# Patient Record
Sex: Male | Born: 1955 | Race: White | Hispanic: No | Marital: Married | Smoking: Former smoker
Health system: Southern US, Community
[De-identification: ages and names within clinical notes are randomized; demographics above are authoritative.]

## PROBLEM LIST (undated history)

## (undated) DIAGNOSIS — Z860100 Personal history of colon polyps, unspecified: Secondary | ICD-10-CM

## (undated) DIAGNOSIS — I495 Sick sinus syndrome: Secondary | ICD-10-CM

## (undated) DIAGNOSIS — Z87891 Personal history of nicotine dependence: Secondary | ICD-10-CM

## (undated) DIAGNOSIS — E739 Lactose intolerance, unspecified: Secondary | ICD-10-CM

## (undated) DIAGNOSIS — E785 Hyperlipidemia, unspecified: Secondary | ICD-10-CM

## (undated) DIAGNOSIS — M722 Plantar fascial fibromatosis: Secondary | ICD-10-CM

## (undated) DIAGNOSIS — Z8619 Personal history of other infectious and parasitic diseases: Secondary | ICD-10-CM

## (undated) DIAGNOSIS — K219 Gastro-esophageal reflux disease without esophagitis: Secondary | ICD-10-CM

## (undated) DIAGNOSIS — R931 Abnormal findings on diagnostic imaging of heart and coronary circulation: Secondary | ICD-10-CM

## (undated) HISTORY — DX: Abnormal findings on diagnostic imaging of heart and coronary circulation: R93.1

## (undated) HISTORY — DX: Morbid (severe) obesity due to excess calories: E66.01

## (undated) HISTORY — DX: Hyperlipidemia, unspecified: E78.5

## (undated) HISTORY — DX: Plantar fascial fibromatosis: M72.2

## (undated) HISTORY — DX: Personal history of nicotine dependence: Z87.891

## (undated) HISTORY — PX: COLONOSCOPY: SHX174

## (undated) HISTORY — DX: Sick sinus syndrome: I49.5

## (undated) HISTORY — DX: Gastro-esophageal reflux disease without esophagitis: K21.9

## (undated) HISTORY — DX: Lactose intolerance, unspecified: E73.9

## (undated) HISTORY — DX: Personal history of colonic polyps: Z86.010

## (undated) HISTORY — DX: Personal history of colon polyps, unspecified: Z86.0100

## (undated) HISTORY — DX: Personal history of other infectious and parasitic diseases: Z86.19

## (undated) HISTORY — PX: ROTATOR CUFF REPAIR: SHX139

---

## 1999-03-07 ENCOUNTER — Emergency Department (HOSPITAL_COMMUNITY): Admission: EM | Admit: 1999-03-07 | Discharge: 1999-03-07 | Payer: Self-pay | Admitting: *Deleted

## 2005-01-18 ENCOUNTER — Ambulatory Visit: Payer: Self-pay | Admitting: Internal Medicine

## 2005-01-25 ENCOUNTER — Ambulatory Visit: Payer: Self-pay | Admitting: Internal Medicine

## 2006-05-16 ENCOUNTER — Ambulatory Visit: Payer: Self-pay | Admitting: Internal Medicine

## 2006-05-25 ENCOUNTER — Ambulatory Visit: Payer: Self-pay | Admitting: Internal Medicine

## 2006-07-13 ENCOUNTER — Ambulatory Visit: Payer: Self-pay | Admitting: Internal Medicine

## 2006-07-31 ENCOUNTER — Ambulatory Visit: Payer: Self-pay | Admitting: Internal Medicine

## 2006-07-31 ENCOUNTER — Encounter (INDEPENDENT_AMBULATORY_CARE_PROVIDER_SITE_OTHER): Payer: Self-pay | Admitting: Specialist

## 2006-07-31 LAB — HM COLONOSCOPY

## 2007-06-27 ENCOUNTER — Ambulatory Visit: Payer: Self-pay | Admitting: Internal Medicine

## 2007-06-27 LAB — CONVERTED CEMR LAB
AST: 24 units/L (ref 0–37)
Albumin: 4.2 g/dL (ref 3.5–5.2)
Basophils Relative: 0.5 % (ref 0.0–1.0)
Bilirubin, Direct: 0.1 mg/dL (ref 0.0–0.3)
Chloride: 108 meq/L (ref 96–112)
Cholesterol: 143 mg/dL (ref 0–200)
Creatinine, Ser: 1 mg/dL (ref 0.4–1.5)
Eosinophils Relative: 4.1 % (ref 0.0–5.0)
Glucose, Bld: 114 mg/dL — ABNORMAL HIGH (ref 70–99)
HCT: 41.4 % (ref 39.0–52.0)
Hgb A1c MFr Bld: 5.8 % (ref 4.6–6.0)
Ketones, ur: NEGATIVE mg/dL
LDL Cholesterol: 81 mg/dL (ref 0–99)
Neutrophils Relative %: 48.2 % (ref 43.0–77.0)
Nitrite: NEGATIVE
RBC: 4.78 M/uL (ref 4.22–5.81)
RDW: 12.8 % (ref 11.5–14.6)
Sodium: 141 meq/L (ref 135–145)
Total Bilirubin: 1.2 mg/dL (ref 0.3–1.2)
Urobilinogen, UA: 0.2 (ref 0.0–1.0)
VLDL: 14 mg/dL (ref 0–40)
WBC: 4.9 10*3/uL (ref 4.5–10.5)
pH: 6 (ref 5.0–8.0)

## 2007-07-05 ENCOUNTER — Ambulatory Visit: Payer: Self-pay | Admitting: Internal Medicine

## 2007-07-06 ENCOUNTER — Encounter: Payer: Self-pay | Admitting: Internal Medicine

## 2007-07-06 DIAGNOSIS — I495 Sick sinus syndrome: Secondary | ICD-10-CM | POA: Insufficient documentation

## 2007-07-06 DIAGNOSIS — E739 Lactose intolerance, unspecified: Secondary | ICD-10-CM

## 2007-07-06 DIAGNOSIS — E785 Hyperlipidemia, unspecified: Secondary | ICD-10-CM

## 2007-07-06 DIAGNOSIS — K219 Gastro-esophageal reflux disease without esophagitis: Secondary | ICD-10-CM

## 2008-08-15 ENCOUNTER — Telehealth: Payer: Self-pay | Admitting: Internal Medicine

## 2008-08-18 ENCOUNTER — Telehealth: Payer: Self-pay | Admitting: Internal Medicine

## 2008-08-27 ENCOUNTER — Ambulatory Visit: Payer: Self-pay | Admitting: Internal Medicine

## 2008-08-27 LAB — CONVERTED CEMR LAB
AST: 23 units/L (ref 0–37)
Alkaline Phosphatase: 31 units/L — ABNORMAL LOW (ref 39–117)
Basophils Absolute: 0 10*3/uL (ref 0.0–0.1)
Bilirubin Urine: NEGATIVE
Chloride: 103 meq/L (ref 96–112)
Cholesterol: 189 mg/dL (ref 0–200)
Eosinophils Absolute: 0.2 10*3/uL (ref 0.0–0.7)
GFR calc non Af Amer: 83 mL/min
Hemoglobin, Urine: NEGATIVE
LDL Cholesterol: 111 mg/dL — ABNORMAL HIGH (ref 0–99)
MCHC: 34.3 g/dL (ref 30.0–36.0)
MCV: 88.9 fL (ref 78.0–100.0)
Neutrophils Relative %: 54.4 % (ref 43.0–77.0)
Platelets: 171 10*3/uL (ref 150–400)
Potassium: 4.5 meq/L (ref 3.5–5.1)
RDW: 12.6 % (ref 11.5–14.6)
Sodium: 140 meq/L (ref 135–145)
TSH: 1.13 microintl units/mL (ref 0.35–5.50)
Total Bilirubin: 1.2 mg/dL (ref 0.3–1.2)
Urine Glucose: NEGATIVE mg/dL
Urobilinogen, UA: 0.2 (ref 0.0–1.0)
VLDL: 35 mg/dL (ref 0–40)
WBC: 5 10*3/uL (ref 4.5–10.5)

## 2008-09-01 ENCOUNTER — Ambulatory Visit: Payer: Self-pay | Admitting: Internal Medicine

## 2008-09-01 DIAGNOSIS — Z860100 Personal history of colon polyps, unspecified: Secondary | ICD-10-CM | POA: Insufficient documentation

## 2009-10-06 ENCOUNTER — Ambulatory Visit: Payer: Self-pay | Admitting: Internal Medicine

## 2009-10-06 LAB — CONVERTED CEMR LAB
ALT: 24 units/L (ref 0–53)
BUN: 11 mg/dL (ref 6–23)
Basophils Absolute: 0.1 10*3/uL (ref 0.0–0.1)
Bilirubin, Direct: 0.2 mg/dL (ref 0.0–0.3)
Chloride: 105 meq/L (ref 96–112)
Cholesterol: 149 mg/dL (ref 0–200)
Creatinine, Ser: 1 mg/dL (ref 0.4–1.5)
Eosinophils Relative: 4.8 % (ref 0.0–5.0)
GFR calc non Af Amer: 82.7 mL/min (ref >60)
Glucose, Bld: 99 mg/dL (ref 70–99)
LDL Cholesterol: 91 mg/dL (ref 0–99)
Leukocytes, UA: NEGATIVE
Lymphs Abs: 1.8 10*3/uL (ref 0.7–4.0)
MCV: 92.4 fL (ref 78.0–100.0)
Monocytes Absolute: 0.3 10*3/uL (ref 0.1–1.0)
Neutrophils Relative %: 45.9 % (ref 43.0–77.0)
Nitrite: NEGATIVE
PSA: 1.45 ng/mL (ref 0.10–4.00)
Platelets: 177 10*3/uL (ref 150.0–400.0)
RDW: 12.3 % (ref 11.5–14.6)
Specific Gravity, Urine: 1.015 (ref 1.000–1.030)
TSH: 0.99 microintl units/mL (ref 0.35–5.50)
Total Bilirubin: 1.2 mg/dL (ref 0.3–1.2)
Triglycerides: 57 mg/dL (ref 0.0–149.0)
Urobilinogen, UA: 0.2 (ref 0.0–1.0)
VLDL: 11.4 mg/dL (ref 0.0–40.0)
WBC: 4.4 10*3/uL — ABNORMAL LOW (ref 4.5–10.5)
pH: 6.5 (ref 5.0–8.0)

## 2009-10-13 ENCOUNTER — Ambulatory Visit: Payer: Self-pay | Admitting: Internal Medicine

## 2009-10-13 DIAGNOSIS — Z87891 Personal history of nicotine dependence: Secondary | ICD-10-CM

## 2009-10-13 DIAGNOSIS — M722 Plantar fascial fibromatosis: Secondary | ICD-10-CM

## 2009-12-16 ENCOUNTER — Telehealth: Payer: Self-pay | Admitting: Internal Medicine

## 2010-10-14 ENCOUNTER — Ambulatory Visit: Payer: Self-pay | Admitting: Internal Medicine

## 2010-10-14 LAB — CONVERTED CEMR LAB
Albumin: 4.1 g/dL (ref 3.5–5.2)
Basophils Absolute: 0 10*3/uL (ref 0.0–0.1)
Basophils Relative: 0.7 % (ref 0.0–3.0)
Bilirubin, Direct: 0.1 mg/dL (ref 0.0–0.3)
CO2: 28 meq/L (ref 19–32)
Calcium: 9 mg/dL (ref 8.4–10.5)
Chloride: 103 meq/L (ref 96–112)
Cholesterol: 144 mg/dL (ref 0–200)
Creatinine, Ser: 1.1 mg/dL (ref 0.4–1.5)
Eosinophils Absolute: 0.2 10*3/uL (ref 0.0–0.7)
Glucose, Bld: 106 mg/dL — ABNORMAL HIGH (ref 70–99)
HDL: 43.6 mg/dL (ref >39.00)
Hemoglobin, Urine: NEGATIVE
Lymphocytes Relative: 39.7 % (ref 12.0–46.0)
MCHC: 34.2 g/dL (ref 30.0–36.0)
MCV: 90.3 fL (ref 78.0–100.0)
Monocytes Absolute: 0.3 10*3/uL (ref 0.1–1.0)
Neutro Abs: 2.3 10*3/uL (ref 1.4–7.7)
Neutrophils Relative %: 49.8 % (ref 43.0–77.0)
Nitrite: NEGATIVE
PSA: 0.77 ng/mL (ref 0.10–4.00)
RBC: 4.65 M/uL (ref 4.22–5.81)
RDW: 13 % (ref 11.5–14.6)
Specific Gravity, Urine: 1.025 (ref 1.000–1.030)
Total CHOL/HDL Ratio: 3
Total Protein: 6.7 g/dL (ref 6.0–8.3)
Triglycerides: 70 mg/dL (ref 0.0–149.0)
Urobilinogen, UA: 0.2 (ref 0.0–1.0)

## 2010-10-19 ENCOUNTER — Encounter: Payer: Self-pay | Admitting: Internal Medicine

## 2010-10-19 ENCOUNTER — Ambulatory Visit: Payer: Self-pay | Admitting: Internal Medicine

## 2010-12-14 NOTE — Assessment & Plan Note (Signed)
Summary: PHYSICAL---STC   Vital Signs:  Patient profile:   55 year old male Height:      72 inches Weight:      243 pounds BMI:     33.08 O2 Sat:      96 % on Room air Temp:     98.0 degrees F oral Pulse rate:   53 / minute BP sitting:   122 / 70  (left arm) Cuff size:   regular  Vitals Entered By: Bill Salinas CMA (October 19, 2010 8:36 AM)  O2 Flow:  Room air  Procedure Note Last Tetanus: Tdap (03/28/2002)  Injections: The patient complains of pain and inflammation. Duration of symptoms: > 3 months Indication: chronic pain Consent signed: yes  Procedure # 1: plantar fascia inj    Region: palmar    Location: R heel    Technique: 25 g needle    Medication: 20 mg depomedrol    Anesthesia: 2.0 ml 1% lidocaine w/o epinephrine    Comment: Risks including but not limited by incomplete procedure, bleeding, infection, recurrence were discussed with the patient. Consent form was signed. US guided inj was performed. Tolerated well. Complicatons - none. Good pain relief following the procedure.   Cleaned and prepped with: alcohol and betadine Wound dressing: bandaid  CC: cpx  Does patient need assistance? Ambulation Normal   CC:  cpx.  History of Present Illness: The patient presents for a preventive health examination  Can he have a shingles shot? C/o R heel pain x months    Current Medications (verified): 1)  Pravachol 40 Mg Tabs (Pravastatin Sodium) .[name].. 1 By Mouth Daily Yearly Cpx Is Due No Refills Until Appt 2)  Omeprazole 20 Mg Cpdr (Omeprazole) .... 2po Once Daily 3)  Adult Aspirin Ec Low Strength 81 Mg Tbec (Aspirin) .[name].. 1 By Mouth Once Daily 4)  Vitamin D 1000 Unit Tabs (Cholecalciferol) .[name].. 1 By Mouth Qd 5)  Ibuprofen 600 Mg Tabs (Ibuprofen) .[name].. 1 By Mouth Bid  Pc X 1 Wk Then As Needed For  Pain 6)  Daily Multi  Tabs (Multiple Vitamins-Minerals) .[name].. 1 Tablet Once Daily 7)  Oyster Shell Calcium/d 250-125 Mg-Unit Tabs (Calcium Carbonate-Vitamin  D)  Allergies (verified): 1)  ! Tetracycline 2)  ! Mevacor  Past History:  Past Medical History: Last updated: 09/01/2008 Glucose Intolerance GERD Hyperlipidemia Morbid Obesity Chronic Sinus Bradycardia Colonic polyps, hx of - rectal  - hyperplastic only 2007 hx of shingles  Past Surgical History: Last updated: 09/01/2008 Rotator cuff repair - right  - approx 1995 - dr Turner Daniels  Family History: Last updated: 09/01/2008 father and sister with PUD, mother with gallbladder disease multiple with dementia but lived to old age (grandparents)  Social History: Last updated: 09/01/2008 work - building maintenance Alcohol use-yes Former Smoker Married 1 child  Review of Systems       The patient complains of difficulty walking.  The patient denies anorexia, fever, weight loss, weight gain, vision loss, decreased hearing, hoarseness, chest pain, syncope, dyspnea on exertion, peripheral edema, prolonged cough, headaches, hemoptysis, abdominal pain, melena, hematochezia, severe indigestion/heartburn, hematuria, incontinence, genital sores, muscle weakness, suspicious skin lesions, transient blindness, depression, unusual weight change, abnormal bleeding, enlarged lymph nodes, angioedema, and testicular masses.    Physical Exam  General:  overweight-appearing.   Head:  Normocephalic and atraumatic without obvious abnormalities. No apparent alopecia or balding. Eyes:  No corneal or conjunctival inflammation noted. EOMI. Perrla. Funduscopic exam benign, without hemorrhages, exudates or papilledema. Vision grossly normal.  Ears:  External ear exam shows no significant lesions or deformities.  Otoscopic examination reveals clear canals, tympanic membranes are intact bilaterally without bulging, retraction, inflammation or discharge. Hearing is grossly normal bilaterally. Nose:  External nasal examination shows no deformity or inflammation. Nasal mucosa are pink and moist without lesions or  exudates. Mouth:  Oral mucosa and oropharynx without lesions or exudates.  Teeth in good repair. Neck:  No deformities, masses, or tenderness noted. Lungs:  Normal respiratory effort, chest expands symmetrically. Lungs are clear to auscultation, no crackles or wheezes. Heart:  Normal rate and regular rhythm. S1 and S2 normal without gallop, murmur, click, rub or other extra sounds. Abdomen:  Bowel sounds positive,abdomen soft and non-tender without masses, organomegaly or hernias noted. Rectal:  No external abnormalities noted. Normal sphincter tone. No rectal masses or tenderness. G(-) Genitalia:  Testes bilaterally descended without nodularity, tenderness or masses. No scrotal masses or lesions. No penis lesions or urethral discharge. Prostate:  1+ enlarged.   Msk:  No deformity or scoliosis noted of thoracic or lumbar spine.  R heel is tender Pulses:  R and L carotid,radial,femoral,dorsalis pedis and posterior tibial pulses are full and equal bilaterally Extremities:  No clubbing, cyanosis, edema, or deformity noted with normal full range of motion of all joints.   Neurologic:  No cranial nerve deficits noted. Station and gait are normal. Plantar reflexes are down-going bilaterally. DTRs are symmetrical throughout. Sensory, motor and coordinative functions appear intact. Skin:  Intact without suspicious lesions or rashes Cervical Nodes:  No lymphadenopathy noted Inguinal Nodes:  No significant adenopathy Psych:  Cognition and judgment appear intact. Alert and cooperative with normal attention span and concentration. No apparent delusions, illusions, hallucinations   Impression & Recommendations:  Problem # 1:  PREVENTIVE HEALTH CARE (ICD-V70.0) Assessment New  Health and age related issues were discussed. Available screening tests and vaccinations were discussed as well. Healthy life style including good diet and exercise was discussed.  The labs were reviewed with the patient.    Orders: EKG w/ Interpretation (93000)  Problem # 2:  PLANTAR FASCIITIS (ICD-728.71) R Assessment: New US guided plantar fascia inj His updated medication list for this problem includes:    Ibuprofen 600 Mg Tabs (Ibuprofen) .[name]... 1 by mouth bid  pc x 1 wk then as needed for  pain  Problem # 3:  COLONIC POLYPS, HX OF (ICD-V12.72) Assessment: Comment Only  Problem # 4:  HYPERLIPIDEMIA (ICD-272.4) Assessment: Improved  His updated medication list for this problem includes:    Pravachol 40 Mg Tabs (Pravastatin sodium) .[name]... 1 by mouth daily yearly cpx is due no refills until appt  Problem # 5:  GERD (ICD-530.81)  His updated medication list for this problem includes:    Omeprazole 20 Mg Cpdr (Omeprazole) .[name]... 2po once daily  Complete Medication List: 1)  Pravachol 40 Mg Tabs (Pravastatin sodium) .[name].. 1 by mouth daily yearly cpx is due no refills until appt 2)  Omeprazole 20 Mg Cpdr (Omeprazole) .... 2po once daily 3)  Adult Aspirin Ec Low Strength 81 Mg Tbec (Aspirin) .[name].. 1 by mouth once daily 4)  Vitamin D 1000 Unit Tabs (Cholecalciferol) .[name].. 1 by mouth qd 5)  Ibuprofen 600 Mg Tabs (Ibuprofen) .[name].. 1 by mouth bid  pc x 1 wk then as needed for  pain  Other Orders: Depo-Medrol 20mg  (J1020) EMR Misc Charge Code [facility])  Patient Instructions: 1)  Please schedule a follow-up appointment in 1 year well w/labs. Prescriptions: IBUPROFEN 600 MG TABS (  IBUPROFEN) 1 by mouth bid  pc x 1 wk then as needed for  pain  #60 x 3   Entered and Authorized by:   Tresa Garter MD   Signed by:   Tresa Garter MD on 10/19/2010   Method used:   Electronically to        [facility] [address] (retail)       [address], [address]       [place], [place]  [address]       Ph: [phone]       Fax: [phone]   RxID:   [id number] OMEPRAZOLE 20 MG CPDR (OMEPRAZOLE) 2po once daily  #180 x 3   Entered and Authorized by:   Tresa Garter  MD   Signed by:   Tresa Garter MD on 10/19/2010   Method used:   Electronically to        [facility] [address] (retail)       [address], [address]       [place], [place]  [address]       Ph: [phone]       Fax: [phone]   RxID:   [id number] PRAVACHOL 40 MG TABS (PRAVASTATIN SODIUM) 1 by mouth daily yearly cpx is due no refills until appt  #90 x 3   Entered and Authorized by:   Tresa Garter MD   Signed by:   Tresa Garter MD on 10/19/2010   Method used:   Electronically to        [facility] [address] (retail)       [address], [address]       [place], [place]  [address]       Ph: [phone]       Fax: [phone]   RxID:   [id number]    Orders Added: 1)  Est. Patient 40-64 years [99396] 2)  Depo-Medrol 20mg  [J1020] 3)  EMR Misc Charge Code [EMRMisc] 4)  EKG w/ Interpretation [93000]

## 2010-12-14 NOTE — Progress Notes (Signed)
Summary: Psychiatric nurse of Call: Left message on machine to call back to office. Patient concealed hand gun permit is complete, and has been faxed. ?Does patient need a copy mailed to his home? Initial call taken by: Lucious Groves,  December 16, 2009 1:30 PM  Follow-up for Phone Call        Pt called back and said he does not need a copy mailed to his home as long it was faxed that will be fine per pt. Follow-up by: Verdell Face,  December 16, 2009 4:31 PM  Additional Follow-up for Phone Call Additional follow up Details #1::        noted. Thanks. Additional Follow-up by: Lucious Groves,  December 17, 2009 11:39 AM

## 2010-12-16 NOTE — Miscellaneous (Signed)
Summary: RT Heel injection/[facility]  RT Heel injection/[facility]   Imported By: Sherian Rein 10/27/2010 11:15:23  _____________________________________________________________________  External Attachment:    Type:   Image     Comment:   External Document

## 2011-10-25 ENCOUNTER — Encounter: Payer: Self-pay | Admitting: Endocrinology

## 2011-10-25 ENCOUNTER — Ambulatory Visit (INDEPENDENT_AMBULATORY_CARE_PROVIDER_SITE_OTHER)
Admission: RE | Admit: 2011-10-25 | Discharge: 2011-10-25 | Disposition: A | Discharge status: Home / Self Care | Payer: 59 | Source: Ambulatory Visit | Attending: Endocrinology | Admitting: Endocrinology

## 2011-10-25 ENCOUNTER — Ambulatory Visit (INDEPENDENT_AMBULATORY_CARE_PROVIDER_SITE_OTHER): Payer: 59 | Admitting: Endocrinology

## 2011-10-25 VITALS — BP 114/66 | HR 69 | Temp 98.3°F

## 2011-10-25 DIAGNOSIS — R05 Cough: Secondary | ICD-10-CM

## 2011-10-25 MED ORDER — PROMETHAZINE-CODEINE 6.25-10 MG/5ML PO SYRP: 5.0000 mL | ORAL_SOLUTION | ORAL | Status: DC | PRN

## 2011-10-25 MED ORDER — AZITHROMYCIN 500 MG PO TABS: 500.0000 mg | ORAL_TABLET | Freq: Every day | ORAL | Status: AC

## 2011-10-25 NOTE — Progress Notes (Signed)
  Subjective:    Patient ID: Joe Williams, male    DOB: [birth year], 55 y.o.   MRN: [id number]  HPI Pt states few weeks of moderate dry-quality cough in the chest, and assoc pain there.  He had recurrent temp of 100 yesterday, and wheezing. Past Medical History  Diagnosis Date  . COLONIC POLYPS, HX OF 09/01/2008    rectal-hyperplastic only 2007  . GERD 07/06/2007  . GLUCOSE INTOLERANCE 07/06/2007  . HYPERLIPIDEMIA 07/06/2007  . PLANTAR FASCIITIS 10/13/2009  . TOBACCO USE, QUIT 10/13/2009  . DYSFUNCTION, SINOATRIAL NODE 07/06/2007  . History of shingles   . Sinus bradycardia     chronic  . Morbid obesity     Past Surgical History  Procedure Date  . Rotator cuff repair 1995 (approx)    right -Dr. Turner Daniels    History   Social History  . Marital Status: Married    Spouse Name: N/A    Number of Children: 1  . Years of Education: N/A   Occupational History  . Building Maintenance  [employer]   Social History Main Topics  . Smoking status: Former Games developer  . Smokeless tobacco: Not on file  . Alcohol Use: Yes  . Drug Use:   . Sexually Active:    Other Topics Concern  . Not on file   Social History Narrative  . No narrative on file    No current outpatient prescriptions on file prior to visit.    Allergies  Allergen Reactions  . Lovastatin   . Tetracycline     Family History  Problem Relation Age of Onset  . Gallbladder disease Mother   . Ulcers Father   . Ulcers Sister   . Dementia Other     Multiple with dementia but lived to old age    BP 114/66  Pulse 69  Temp(Src) 98.3 F (36.8 C) (Oral)  SpO2 97%    Review of Systems Denies n/v, but he has doe.      Objective:   Physical Exam VITAL SIGNS:  See vs page GENERAL: no distress head: no deformity eyes: no periorbital swelling, no proptosis external nose and ears are normal mouth: no lesion seen Left tm is red, but the right is normal NECK: There is no palpable thyroid enlargement.  No  thyroid nodule is palpable.  No palpable lymphadenopathy at the anterior neck. LUNGS:  Clear to auscultation.   (cxr: nad)    Assessment & Plan:  Acute bronchitis, new

## 2011-10-25 NOTE — Patient Instructions (Addendum)
Let's check a chest-x-ray.  Then please call [phone] to hear your test results.  You will be prompted to enter the 9-digit "MRN" number that appears at the top left of this page, followed by #.  Then you will hear the message.   here is a sample of "dulera-200."  take 1 puff 2x a day.  rinse mouth after using. Here are 2 prescriptions: 1 for an antibiotic, and 1 for cough syrup.   (update: i left message on phone-tree:  rx as we discussed)

## 2011-11-25 ENCOUNTER — Other Ambulatory Visit: Payer: Self-pay | Admitting: *Deleted

## 2011-11-25 ENCOUNTER — Other Ambulatory Visit (INDEPENDENT_AMBULATORY_CARE_PROVIDER_SITE_OTHER): Payer: 59

## 2011-11-25 DIAGNOSIS — Z0000 Encounter for general adult medical examination without abnormal findings: Secondary | ICD-10-CM

## 2011-11-25 DIAGNOSIS — Z0389 Encounter for observation for other suspected diseases and conditions ruled out: Secondary | ICD-10-CM

## 2011-11-25 LAB — CBC WITH DIFFERENTIAL/PLATELET
Basophils Relative: 1.1 % (ref 0.0–3.0)
Eosinophils Absolute: 0.2 10*3/uL (ref 0.0–0.7)
HCT: 42.2 % (ref 39.0–52.0)
Hemoglobin: 14.4 g/dL (ref 13.0–17.0)
Lymphocytes Relative: 38.6 % (ref 12.0–46.0)
Lymphs Abs: 1.8 10*3/uL (ref 0.7–4.0)
MCHC: 34 g/dL (ref 30.0–36.0)
Neutro Abs: 2.3 10*3/uL (ref 1.4–7.7)
RBC: 4.67 Mil/uL (ref 4.22–5.81)

## 2011-11-25 LAB — BASIC METABOLIC PANEL
CO2: 25 mEq/L (ref 19–32)
Calcium: 9.1 mg/dL (ref 8.4–10.5)
GFR: 87.05 mL/min (ref >60.00)
Sodium: 140 mEq/L (ref 135–145)

## 2011-11-25 LAB — URINALYSIS, ROUTINE W REFLEX MICROSCOPIC
Specific Gravity, Urine: 1.025 (ref 1.000–1.030)
Total Protein, Urine: NEGATIVE
Urine Glucose: NEGATIVE
WBC, UA: NONE SEEN (ref 0–?)

## 2011-11-25 LAB — LIPID PANEL
HDL: 46.8 mg/dL (ref >39.00)
Total CHOL/HDL Ratio: 3
VLDL: 16.8 mg/dL (ref 0.0–40.0)

## 2011-11-25 LAB — HEPATIC FUNCTION PANEL
Alkaline Phosphatase: 28 U/L — ABNORMAL LOW (ref 39–117)
Bilirubin, Direct: 0.1 mg/dL (ref 0.0–0.3)

## 2011-12-02 ENCOUNTER — Other Ambulatory Visit: Payer: Self-pay | Admitting: Internal Medicine

## 2011-12-02 ENCOUNTER — Encounter: Payer: Self-pay | Admitting: Internal Medicine

## 2011-12-14 ENCOUNTER — Ambulatory Visit (INDEPENDENT_AMBULATORY_CARE_PROVIDER_SITE_OTHER): Payer: 59 | Admitting: Internal Medicine

## 2011-12-14 ENCOUNTER — Encounter: Payer: Self-pay | Admitting: Internal Medicine

## 2011-12-14 VITALS — BP 138/80 | HR 80 | Temp 97.5°F | Resp 16 | Ht 72.0 in | Wt 250.0 lb

## 2011-12-14 DIAGNOSIS — Z5689 Other problems related to employment: Secondary | ICD-10-CM

## 2011-12-14 DIAGNOSIS — R7309 Other abnormal glucose: Secondary | ICD-10-CM

## 2011-12-14 DIAGNOSIS — Z0000 Encounter for general adult medical examination without abnormal findings: Secondary | ICD-10-CM

## 2011-12-14 DIAGNOSIS — N529 Male erectile dysfunction, unspecified: Secondary | ICD-10-CM

## 2011-12-14 DIAGNOSIS — E785 Hyperlipidemia, unspecified: Secondary | ICD-10-CM

## 2011-12-14 DIAGNOSIS — R739 Hyperglycemia, unspecified: Secondary | ICD-10-CM

## 2011-12-14 DIAGNOSIS — Z566 Other physical and mental strain related to work: Secondary | ICD-10-CM

## 2011-12-14 MED ORDER — CLONAZEPAM 0.5 MG PO TABS: 0.5000 mg | ORAL_TABLET | Freq: Two times a day (BID) | ORAL | Status: AC | PRN

## 2011-12-14 MED ORDER — OMEPRAZOLE 20 MG PO CPDR: 40.0000 mg | DELAYED_RELEASE_CAPSULE | Freq: Every day | ORAL | Status: DC

## 2011-12-14 MED ORDER — SILDENAFIL CITRATE 100 MG PO TABS: 50.0000 mg | ORAL_TABLET | Freq: Every day | ORAL | Status: AC | PRN

## 2011-12-14 MED ORDER — PRAVASTATIN SODIUM 40 MG PO TABS: 40.0000 mg | ORAL_TABLET | Freq: Every day | ORAL | Status: DC

## 2011-12-14 NOTE — Assessment & Plan Note (Signed)
Start on rx

## 2011-12-14 NOTE — Assessment & Plan Note (Signed)
We discussed age appropriate health related issues, including available/recomended screening tests and vaccinations. We discussed a need for adhering to healthy diet and exercise. Labs/EKG were reviewed/ordered. All questions were answered.   

## 2011-12-14 NOTE — Assessment & Plan Note (Signed)
Continue with current prescription therapy as reflected on the Med list.  

## 2011-12-14 NOTE — Assessment & Plan Note (Signed)
Labs in 3 mo

## 2011-12-14 NOTE — Progress Notes (Signed)
  Subjective:    Patient ID: Joe Williams, male    DOB: 10/24/1956, 56 y.o.   MRN: [id number]  HPI  The patient is here for a wellness exam. The patient has been doing well overall without major physical or psychological issues going on lately. 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  HENT: Negative for nosebleeds, congestion, sore throat, sneezing, trouble swallowing and neck pain.   Eyes: Negative for itching and visual disturbance.  Respiratory: Negative for cough.   Cardiovascular: Negative for chest pain, palpitations and leg swelling.  Gastrointestinal: Negative for nausea, diarrhea, blood in stool and abdominal distention.  Genitourinary: Negative for frequency and hematuria.  Musculoskeletal: Negative for back pain, joint swelling and gait problem.  Skin: Negative for rash.  Neurological: Negative for dizziness, tremors, speech difficulty and weakness.  Psychiatric/Behavioral: Positive for sleep disturbance and dysphoric mood. Negative for suicidal ideas and agitation. The patient is not nervous/anxious.        Objective:   Physical Exam  Constitutional: He is oriented to person, place, and time. He appears well-developed.  HENT:  Mouth/Throat: Oropharynx is clear and moist.  Eyes: Conjunctivae are normal. Pupils are equal, round, and reactive to light.  Neck: Normal range of motion. No JVD present. No thyromegaly present.  Cardiovascular: Normal rate, regular rhythm, normal heart sounds and intact distal pulses.  Exam reveals no gallop and no friction rub.   No murmur heard. Pulmonary/Chest: Effort normal and breath sounds normal. No  respiratory distress. He has no wheezes. He has no rales. He exhibits no tenderness.  Abdominal: Soft. Bowel sounds are normal. He exhibits no distension and no mass. There is no tenderness. There is no rebound and no guarding.  Musculoskeletal: Normal range of motion. He exhibits no edema and no tenderness.  Lymphadenopathy:    He has no cervical adenopathy.  Neurological: He is alert and oriented to person, place, and time. He has normal reflexes. No cranial nerve deficit. He exhibits normal muscle tone. Coordination normal.  Skin: Skin is warm and dry. No rash noted.  Psychiatric: He has a normal mood and affect. His behavior is normal. Judgment and thought content normal.   Lab Results  Component Value Date   WBC 4.7 11/25/2011   HGB 14.4 11/25/2011   HCT 42.2 11/25/2011   PLT 158.0 11/25/2011   GLUCOSE 102* 11/25/2011   CHOL 163 11/25/2011   TRIG 84.0 11/25/2011   HDL 46.80 11/25/2011   LDLCALC 99 11/25/2011   ALT 32 11/25/2011   AST 22 11/25/2011   NA 140 11/25/2011   K 4.6 11/25/2011   CL 107 11/25/2011   CREATININE 1.0 11/25/2011   BUN 20 11/25/2011   CO2 25 11/25/2011   TSH 1.10 11/25/2011   PSA 0.73 11/25/2011   HGBA1C 5.9 08/27/2008          Assessment & Plan:

## 2011-12-14 NOTE — Assessment & Plan Note (Signed)
Discussed.

## 2012-03-01 ENCOUNTER — Ambulatory Visit (INDEPENDENT_AMBULATORY_CARE_PROVIDER_SITE_OTHER): Payer: 59 | Admitting: Internal Medicine

## 2012-03-01 ENCOUNTER — Encounter: Payer: Self-pay | Admitting: Internal Medicine

## 2012-03-01 VITALS — BP 130/70 | HR 75 | Temp 98.3°F | Ht 72.0 in | Wt 250.4 lb

## 2012-03-01 DIAGNOSIS — R05 Cough: Secondary | ICD-10-CM

## 2012-03-01 DIAGNOSIS — J309 Allergic rhinitis, unspecified: Secondary | ICD-10-CM

## 2012-03-01 DIAGNOSIS — J019 Acute sinusitis, unspecified: Secondary | ICD-10-CM

## 2012-03-01 MED ORDER — AMOXICILLIN-POT CLAVULANATE 875-125 MG PO TABS: 1.0000 | ORAL_TABLET | Freq: Two times a day (BID) | ORAL | Status: AC

## 2012-03-01 MED ORDER — PROMETHAZINE-CODEINE 6.25-10 MG/5ML PO SYRP: 5.0000 mL | ORAL_SOLUTION | ORAL | Status: AC | PRN

## 2012-03-01 MED ORDER — CETIRIZINE-PSEUDOEPHEDRINE ER 5-120 MG PO TB12: 1.0000 | ORAL_TABLET | Freq: Two times a day (BID) | ORAL | Status: AC

## 2012-03-01 NOTE — Progress Notes (Signed)
  Subjective:    HPI  complains of head cold symptoms ?sinus infection Onset >1 week ago, wax/wane symptoms  associated with rhinorrhea, sneezing, sore throat, mild headache and low grade fever, esp night Also myalgias, sinus pressure and mild-mod chest congestion - cough keeps awake at night No relief with OTC meds Precipitated by sick contacts, season change and "spraying yard" 4 days ago  Past Medical History  Diagnosis Date  . COLONIC POLYPS, HX OF     rectal-hyperplastic only 2007  . GERD   . GLUCOSE INTOLERANCE   . HYPERLIPIDEMIA   . PLANTAR FASCIITIS   . TOBACCO USE, QUIT   . DYSFUNCTION, SINOATRIAL NODE     chronic sinus brady  . History of shingles   . Morbid obesity     Review of Systems Constitutional: No unexpected weight change Pulmonary: No pleurisy or hemoptysis Cardiovascular: No chest pain or palpitations     Objective:   Physical Exam BP 130/70  Pulse 75  Temp(Src) 98.3 F (36.8 C) (Oral)  Ht 6' (1.829 m)  Wt 250 lb 6.4 oz (113.581 kg)  BMI 33.96 kg/m2  SpO2 97% GEN: mildly ill appearing and audible head/chest congestion HENT: NCAT, mild sinus tenderness bilaterally, nares with clear discharge, oropharynx mild erythema, no exudate Eyes: Vision grossly intact, no conjunctivitis Lungs: Clear to auscultation without rhonchi or wheeze, no increased work of breathing Cardiovascular: Regular rate and rhythm, no bilateral edema      Assessment & Plan:  Viral URI >>acute sinusitis allergic rhinitis - seasonal Cough, postnasal drip related to above    Empiric antibiotics prescribed due to symptom duration greater than 7 days recommended  OTC antihistamine with decongestant Prescription cough suppression - new prescriptions done Symptomatic care with Tylenol or Advil, hydration and rest -  salt gargle advised as needed

## 2012-03-01 NOTE — Patient Instructions (Signed)
It was good to see you today. Augmentin antibiotics, and codeine cough syrup - Your prescription(s) have been submitted to your pharmacy. Please take as directed and contact our office if you believe you are having problem(s) with the medication(s). Continue Zyrtec D Alternate between ibuprofen and tylenol for aches, pain and fever symptoms as discussed Hydrate, rest and call if symptoms worse or unimproved in next 7-10days Sinusitis Sinuses are air pockets within the bones of your face. The growth of bacteria within a sinus leads to infection. The infection prevents the sinuses from draining. This infection is called sinusitis. SYMPTOMS   There will be different areas of pain depending on which sinuses have become infected.  The maxillary sinuses often produce pain beneath the eyes.   Frontal sinusitis may cause pain in the middle of the forehead and above the eyes.  Other problems (symptoms) include:  Toothaches.   Colored, pus-like (purulent) drainage from the nose.   Swelling, warmth, and tenderness over the sinus areas may be signs of infection.  TREATMENT   Sinusitis is most often determined by an exam.X-rays may be taken. If x-rays have been taken, make sure you obtain your results or find out how you are to obtain them. Your caregiver may give you medications (antibiotics). These are medications that will help kill the bacteria causing the infection. You may also be given a medication (decongestant) that helps to reduce sinus swelling.   HOME CARE INSTRUCTIONS    Only take over-the-counter or prescription medicines for pain, discomfort, or fever as directed by your caregiver.   Drink extra fluids. Fluids help thin the mucus so your sinuses can drain more easily.   Applying either moist heat or ice packs to the sinus areas may help relieve discomfort.   Use saline nasal sprays to help moisten your sinuses. The sprays can be found at your local drugstore.  SEEK IMMEDIATE MEDICAL  CARE IF:  You have a fever.   You have increasing pain, severe headaches, or toothache.   You have nausea, vomiting, or drowsiness.   You develop unusual swelling around the face or trouble seeing.  MAKE SURE YOU:    Understand these instructions.   Will watch your condition.   Will get help right away if you are not doing well or get worse.  Document Released: 10/31/2005 Document Revised: 10/20/2011 Document Reviewed: 05/30/2007 [facility] Patient Information 2012 [place], [place].

## 2012-11-27 ENCOUNTER — Telehealth: Payer: Self-pay | Admitting: *Deleted

## 2012-11-27 DIAGNOSIS — Z0000 Encounter for general adult medical examination without abnormal findings: Secondary | ICD-10-CM

## 2012-11-27 DIAGNOSIS — Z0389 Encounter for observation for other suspected diseases and conditions ruled out: Secondary | ICD-10-CM

## 2012-11-27 NOTE — Telephone Encounter (Signed)
Done

## 2012-11-27 NOTE — Telephone Encounter (Signed)
Message copied by Merrilyn Puma on Tue Nov 27, 2012  9:26 AM ------      Message from: Etheleen Sia      Created: Mon Oct 22, 2012  3:22 PM      Regarding: LAB       [facility] LABS END OF JAN

## 2012-12-06 ENCOUNTER — Other Ambulatory Visit (INDEPENDENT_AMBULATORY_CARE_PROVIDER_SITE_OTHER): Payer: 59

## 2012-12-06 DIAGNOSIS — Z566 Other physical and mental strain related to work: Secondary | ICD-10-CM

## 2012-12-06 DIAGNOSIS — R7309 Other abnormal glucose: Secondary | ICD-10-CM

## 2012-12-06 DIAGNOSIS — R739 Hyperglycemia, unspecified: Secondary | ICD-10-CM

## 2012-12-06 DIAGNOSIS — Z0000 Encounter for general adult medical examination without abnormal findings: Secondary | ICD-10-CM

## 2012-12-06 DIAGNOSIS — Z0389 Encounter for observation for other suspected diseases and conditions ruled out: Secondary | ICD-10-CM

## 2012-12-06 DIAGNOSIS — E785 Hyperlipidemia, unspecified: Secondary | ICD-10-CM

## 2012-12-06 DIAGNOSIS — N529 Male erectile dysfunction, unspecified: Secondary | ICD-10-CM

## 2012-12-06 DIAGNOSIS — Z569 Unspecified problems related to employment: Secondary | ICD-10-CM

## 2012-12-06 LAB — URINALYSIS, ROUTINE W REFLEX MICROSCOPIC
Bilirubin Urine: NEGATIVE
Nitrite: NEGATIVE
Specific Gravity, Urine: 1.025 (ref 1.000–1.030)
Total Protein, Urine: NEGATIVE
pH: 5.5 (ref 5.0–8.0)

## 2012-12-06 LAB — TSH: TSH: 1.54 u[IU]/mL (ref 0.35–5.50)

## 2012-12-06 LAB — BASIC METABOLIC PANEL
BUN: 17 mg/dL (ref 6–23)
Calcium: 9.1 mg/dL (ref 8.4–10.5)
GFR: 83.68 mL/min (ref >60.00)
Glucose, Bld: 111 mg/dL — ABNORMAL HIGH (ref 70–99)
Potassium: 4.6 mEq/L (ref 3.5–5.1)

## 2012-12-06 LAB — HEPATIC FUNCTION PANEL
Alkaline Phosphatase: 29 U/L — ABNORMAL LOW (ref 39–117)
Bilirubin, Direct: 0.1 mg/dL (ref 0.0–0.3)

## 2012-12-06 LAB — CBC WITH DIFFERENTIAL/PLATELET
Basophils Relative: 0.8 % (ref 0.0–3.0)
Eosinophils Relative: 3.1 % (ref 0.0–5.0)
Lymphs Abs: 1.8 10*3/uL (ref 0.7–4.0)
MCHC: 34 g/dL (ref 30.0–36.0)
Monocytes Absolute: 0.3 10*3/uL (ref 0.1–1.0)
Neutro Abs: 2.7 10*3/uL (ref 1.4–7.7)
RDW: 13.3 % (ref 11.5–14.6)

## 2012-12-06 LAB — PSA: PSA: 0.79 ng/mL (ref 0.10–4.00)

## 2012-12-06 LAB — TESTOSTERONE: Testosterone: 341.78 ng/dL — ABNORMAL LOW (ref 350.00–890.00)

## 2012-12-06 LAB — LIPID PANEL
LDL Cholesterol: 96 mg/dL (ref 0–99)
Total CHOL/HDL Ratio: 4

## 2012-12-14 ENCOUNTER — Encounter: Payer: Self-pay | Admitting: Internal Medicine

## 2012-12-14 ENCOUNTER — Ambulatory Visit (INDEPENDENT_AMBULATORY_CARE_PROVIDER_SITE_OTHER): Payer: 59 | Admitting: Internal Medicine

## 2012-12-14 VITALS — BP 118/78 | HR 80 | Temp 98.2°F | Resp 16 | Ht 72.0 in | Wt 250.0 lb

## 2012-12-14 DIAGNOSIS — Z0000 Encounter for general adult medical examination without abnormal findings: Secondary | ICD-10-CM

## 2012-12-14 DIAGNOSIS — R011 Cardiac murmur, unspecified: Secondary | ICD-10-CM

## 2012-12-14 DIAGNOSIS — R739 Hyperglycemia, unspecified: Secondary | ICD-10-CM

## 2012-12-14 DIAGNOSIS — R9431 Abnormal electrocardiogram [ECG] [EKG]: Secondary | ICD-10-CM

## 2012-12-14 DIAGNOSIS — E291 Testicular hypofunction: Secondary | ICD-10-CM

## 2012-12-14 DIAGNOSIS — R7989 Other specified abnormal findings of blood chemistry: Secondary | ICD-10-CM | POA: Insufficient documentation

## 2012-12-14 DIAGNOSIS — E785 Hyperlipidemia, unspecified: Secondary | ICD-10-CM

## 2012-12-14 MED ORDER — PRAVASTATIN SODIUM 40 MG PO TABS: 40.0000 mg | ORAL_TABLET | Freq: Every day | ORAL | Status: DC

## 2012-12-14 MED ORDER — OMEPRAZOLE 20 MG PO CPDR: 40.0000 mg | DELAYED_RELEASE_CAPSULE | Freq: Every day | ORAL | Status: DC

## 2012-12-14 NOTE — Assessment & Plan Note (Signed)
Loose wt 

## 2012-12-14 NOTE — Assessment & Plan Note (Signed)
Will get an ECHO 

## 2012-12-14 NOTE — Assessment & Plan Note (Signed)
Continue with current prescription therapy as reflected on the Med list.  

## 2012-12-14 NOTE — Progress Notes (Signed)
Subjective:    Patient ID: Joe Williams, male    DOB: Aug 12, 1956, 57 y.o.   MRN: [id number]  HPI  The patient is here for a wellness exam. The patient has been doing well overall without major physical or psychological issues going on lately. The patient needs to address  chronic hypertension that has been well controlled with medicines; to address chronic  hyperlipidemia controlled with medicines as well No cramps in the muscles No palpitations F/u stress at work 80+ people he may retire this summer F/u insomnia - better  BP Readings from Last 3 Encounters:  12/14/12 118/78  03/01/12 130/70  12/14/11 138/80   Wt Readings from Last 3 Encounters:  12/14/12 250 lb (113.399 kg)  03/01/12 250 lb 6.4 oz (113.581 kg)  12/14/11 250 lb (113.399 kg)     Review of Systems  Constitutional: Negative for appetite change, fatigue and unexpected weight change.  HENT: Negative for nosebleeds, congestion, sore throat, sneezing, trouble swallowing and neck pain.   Eyes: Negative for itching and visual disturbance.  Respiratory: Negative for cough.   Cardiovascular: Negative for chest pain, palpitations and leg swelling.  Gastrointestinal: Negative for nausea, diarrhea, blood in stool and abdominal distention.  Genitourinary: Negative for frequency and hematuria.  Musculoskeletal: Negative for back pain, joint swelling and gait problem.  Skin: Negative for rash.  Neurological: Negative for dizziness, tremors, speech difficulty and weakness.  Psychiatric/Behavioral: Positive for sleep disturbance and dysphoric mood. Negative for suicidal ideas and agitation. The patient is not nervous/anxious.    BP 118/78  Pulse 80  Temp 98.2 F (36.8 C) (Oral)  Resp 16  Ht 6' (1.829 m)  Wt 250 lb (113.399 kg)  BMI 33.91 kg/m2     Objective:   Physical Exam  Constitutional: He is oriented to person, place, and time. He appears well-developed.  HENT:  Mouth/Throat: Oropharynx is clear and moist.   Eyes: Conjunctivae are normal. Pupils are equal, round, and reactive to light.  Neck: Normal range of motion. No JVD present. No thyromegaly present.  Cardiovascular: Normal rate, regular rhythm, normal heart sounds and intact distal pulses.  Exam reveals no gallop and no friction rub.   1/6 murmur heard. Pulmonary/Chest: Effort normal and breath sounds normal. No respiratory distress. He has no wheezes. He has no rales. He exhibits no tenderness.  Abdominal: Soft. Bowel sounds are normal. He exhibits no distension and no mass. There is no tenderness. There is no rebound and no guarding.  Musculoskeletal: Normal range of motion. He exhibits no edema and no tenderness.  Lymphadenopathy:    He has no cervical adenopathy.  Neurological: He is alert and oriented to person, place, and time. He has normal reflexes. No cranial nerve deficit. He exhibits normal muscle tone. Coordination normal.  Skin: Skin is warm and dry. No rash noted.  Psychiatric: He has a normal mood and affect. His behavior is normal. Judgment and thought content normal.   Lab Results  Value Date   Lab Results  Component Value Date   WBC 5.0 12/06/2012   HGB 14.2 12/06/2012   HCT 41.8 12/06/2012   PLT 181.0 12/06/2012   GLUCOSE 111* 12/06/2012   CHOL 160 12/06/2012   TRIG 103.0 12/06/2012   HDL 43.60 12/06/2012   LDLCALC 96 12/06/2012   ALT 24 12/06/2012   AST 19 12/06/2012   NA 138 12/06/2012   K 4.6 12/06/2012   CL 105 12/06/2012   CREATININE 1.0 12/06/2012   BUN 17 12/06/2012  CO2 28 12/06/2012   TSH 1.54 12/06/2012   PSA 0.79 12/06/2012   HGBA1C 5.9 12/06/2012           Assessment & Plan:    Subjective:       Assessment & Plan:

## 2012-12-14 NOTE — Assessment & Plan Note (Signed)
Loose wt, exercise Options discussed

## 2012-12-20 ENCOUNTER — Ambulatory Visit (HOSPITAL_COMMUNITY): Payer: 59 | Attending: Cardiology

## 2012-12-20 DIAGNOSIS — R9431 Abnormal electrocardiogram [ECG] [EKG]: Secondary | ICD-10-CM | POA: Insufficient documentation

## 2012-12-20 DIAGNOSIS — Z6833 Body mass index (BMI) 33.0-33.9, adult: Secondary | ICD-10-CM | POA: Insufficient documentation

## 2012-12-20 DIAGNOSIS — R011 Cardiac murmur, unspecified: Secondary | ICD-10-CM | POA: Insufficient documentation

## 2012-12-20 NOTE — Progress Notes (Signed)
Echocardiogram performed.  

## 2012-12-21 ENCOUNTER — Telehealth: Payer: Self-pay | Admitting: Internal Medicine

## 2012-12-21 NOTE — Telephone Encounter (Signed)
Misty Stanley, please, inform patient that his card echo was ok Thx

## 2012-12-21 NOTE — Telephone Encounter (Signed)
Pts wife informed.

## 2012-12-24 ENCOUNTER — Other Ambulatory Visit: Payer: Self-pay | Admitting: Internal Medicine

## 2012-12-26 ENCOUNTER — Other Ambulatory Visit: Payer: Self-pay | Admitting: Internal Medicine

## 2013-04-04 ENCOUNTER — Inpatient Hospital Stay (HOSPITAL_COMMUNITY): Admission: RE | Admit: 2013-04-04 | Payer: 59 | Source: Ambulatory Visit | Admitting: Physical Therapy

## 2013-04-09 ENCOUNTER — Ambulatory Visit (HOSPITAL_COMMUNITY): Payer: 59 | Admitting: Physical Therapy

## 2013-04-11 ENCOUNTER — Ambulatory Visit (HOSPITAL_COMMUNITY): Payer: 59 | Admitting: Physical Therapy

## 2013-04-16 ENCOUNTER — Ambulatory Visit (HOSPITAL_COMMUNITY): Payer: 59 | Admitting: *Deleted

## 2013-04-18 ENCOUNTER — Ambulatory Visit (HOSPITAL_COMMUNITY): Payer: 59 | Admitting: *Deleted

## 2013-04-23 ENCOUNTER — Ambulatory Visit (HOSPITAL_COMMUNITY): Payer: 59 | Admitting: Physical Therapy

## 2013-04-25 ENCOUNTER — Ambulatory Visit (HOSPITAL_COMMUNITY): Payer: 59 | Admitting: Physical Therapy

## 2013-05-20 ENCOUNTER — Other Ambulatory Visit: Payer: Self-pay | Admitting: Internal Medicine

## 2013-05-20 ENCOUNTER — Ambulatory Visit (INDEPENDENT_AMBULATORY_CARE_PROVIDER_SITE_OTHER): Payer: 59 | Admitting: Internal Medicine

## 2013-05-20 ENCOUNTER — Other Ambulatory Visit (INDEPENDENT_AMBULATORY_CARE_PROVIDER_SITE_OTHER): Payer: 59

## 2013-05-20 ENCOUNTER — Encounter: Payer: Self-pay | Admitting: Internal Medicine

## 2013-05-20 VITALS — BP 118/78 | HR 80 | Temp 97.8°F | Resp 16 | Wt 247.0 lb

## 2013-05-20 DIAGNOSIS — R509 Fever, unspecified: Secondary | ICD-10-CM

## 2013-05-20 DIAGNOSIS — R51 Headache: Secondary | ICD-10-CM

## 2013-05-20 DIAGNOSIS — R519 Headache, unspecified: Secondary | ICD-10-CM | POA: Insufficient documentation

## 2013-05-20 LAB — CBC WITH DIFFERENTIAL/PLATELET
Basophils Relative: 0.6 % (ref 0.0–3.0)
Eosinophils Absolute: 0 10*3/uL (ref 0.0–0.7)
Eosinophils Relative: 1.1 % (ref 0.0–5.0)
Lymphocytes Relative: 17.2 % (ref 12.0–46.0)
Neutrophils Relative %: 70.7 % (ref 43.0–77.0)
RBC: 5.09 Mil/uL (ref 4.22–5.81)
WBC: 2.4 10*3/uL — ABNORMAL LOW (ref 4.5–10.5)

## 2013-05-20 LAB — URINALYSIS
Hgb urine dipstick: NEGATIVE
Leukocytes, UA: NEGATIVE
Nitrite: NEGATIVE
Specific Gravity, Urine: 1.025 (ref 1.000–1.030)
Urine Glucose: NEGATIVE
Urobilinogen, UA: 1 (ref 0.0–1.0)

## 2013-05-20 LAB — HEPATIC FUNCTION PANEL
ALT: 56 U/L — ABNORMAL HIGH (ref 0–53)
Total Protein: 7.3 g/dL (ref 6.0–8.3)

## 2013-05-20 LAB — BASIC METABOLIC PANEL
Creatinine, Ser: 1.2 mg/dL (ref 0.4–1.5)
Glucose, Bld: 151 mg/dL — ABNORMAL HIGH (ref 70–99)
Potassium: 4.1 mEq/L (ref 3.5–5.1)
Sodium: 137 mEq/L (ref 135–145)

## 2013-05-20 MED ORDER — BUTALBITAL-APAP-CAFFEINE 50-325-40 MG PO TABS: 1.0000 | ORAL_TABLET | Freq: Two times a day (BID) | ORAL | Status: DC | PRN

## 2013-05-20 MED ORDER — AMOXICILLIN 500 MG PO CAPS: 1000.0000 mg | ORAL_CAPSULE | Freq: Two times a day (BID) | ORAL | Status: DC

## 2013-05-20 NOTE — Progress Notes (Signed)
  Subjective:     HPI  C/o fever, cramps, chills, sweats, bad HA x since this weekend. H/o tick bite 1-2 wks ago. No rash. No diarrhea. No dark urine.   F/u stress at work 80+ people he may retire this summer F/u insomnia - better  BP Readings from Last 3 Encounters:  05/20/13 118/78  12/14/12 118/78  03/01/12 130/70   Wt Readings from Last 3 Encounters:  05/20/13 247 lb (112.038 kg)  12/14/12 250 lb (113.399 kg)  03/01/12 250 lb 6.4 oz (113.581 kg)     Review of Systems  Constitutional: Negative for appetite change, fatigue and unexpected weight change.  HENT: Negative for nosebleeds, congestion, sore throat, sneezing, trouble swallowing and neck pain.   Eyes: Negative for itching and visual disturbance.  Respiratory: Negative for cough.   Cardiovascular: Negative for chest pain, palpitations and leg swelling.  Gastrointestinal: Negative for nausea, diarrhea, blood in stool and abdominal distention.  Genitourinary: Negative for frequency and hematuria.  Musculoskeletal: Negative for back pain, joint swelling and gait problem.  Skin: Negative for rash.  Neurological: Negative for dizziness, tremors, speech difficulty and weakness.  Psychiatric/Behavioral: Positive for sleep disturbance and dysphoric mood. Negative for suicidal ideas and agitation. The patient is not nervous/anxious.    BP 118/78  Pulse 80  Temp(Src) 97.8 F (36.6 C) (Oral)  Resp 16  Wt 247 lb (112.038 kg)  BMI 33.49 kg/m2     Objective:   Physical Exam  Constitutional: He is oriented to person, place, and time. He appears well-developed.  HENT:  Mouth/Throat: Oropharynx is clear and moist.  Eyes: Conjunctivae are normal. Pupils are equal, round, and reactive to light.  Neck: Normal range of motion. No JVD present. No thyromegaly present.  Cardiovascular: Normal rate, regular rhythm, normal heart sounds and intact distal pulses.  Exam reveals no gallop and no friction rub.   1/6 murmur  heard. Pulmonary/Chest: Effort normal and breath sounds normal. No respiratory distress. He has no wheezes. He has no rales. He exhibits no tenderness.  Abdominal: Soft. Bowel sounds are normal. He exhibits no distension and no mass. There is no tenderness. There is no rebound and no guarding.  Musculoskeletal: Normal range of motion. He exhibits no edema and no tenderness.  Lymphadenopathy:    He has no cervical adenopathy.  Neurological: He is alert and oriented to person, place, and time. He has normal reflexes. No cranial nerve deficit. He exhibits normal muscle tone. Coordination normal.  Skin: Skin is warm and dry. No rash noted.  Psychiatric: He has a normal mood and affect. His behavior is normal. Judgment and thought content normal.   Lab Results  Value Date   Lab Results  Component Value Date   WBC 5.0 12/06/2012   HGB 14.2 12/06/2012   HCT 41.8 12/06/2012   PLT 181.0 12/06/2012   GLUCOSE 111* 12/06/2012   CHOL 160 12/06/2012   TRIG 103.0 12/06/2012   HDL 43.60 12/06/2012   LDLCALC 96 12/06/2012   ALT 24 12/06/2012   AST 19 12/06/2012   NA 138 12/06/2012   K 4.6 12/06/2012   CL 105 12/06/2012   CREATININE 1.0 12/06/2012   BUN 17 12/06/2012   CO2 28 12/06/2012   TSH 1.54 12/06/2012   PSA 0.79 12/06/2012   HGBA1C 5.9 12/06/2012           Assessment & Plan:    Subjective:       Assessment & Plan:

## 2013-05-20 NOTE — Assessment & Plan Note (Signed)
Labs  H/o tick bites  Labs If worse- Amoxicillin

## 2013-05-20 NOTE — Assessment & Plan Note (Signed)
Fioricet prn Advil prn

## 2013-05-21 LAB — ROCKY MTN SPOTTED FVR AB, IGG-BLOOD: RMSF IgG: 3.67 IV — ABNORMAL HIGH

## 2013-05-22 ENCOUNTER — Encounter: Payer: Self-pay | Admitting: Internal Medicine

## 2013-05-24 ENCOUNTER — Telehealth: Payer: Self-pay | Admitting: *Deleted

## 2013-05-24 NOTE — Telephone Encounter (Signed)
Called [facility] lab to see why [place] spotted fever IgM test was not resulted. The representative I spoke to states it was never ordered. i informed her the order is still sitting in Epic as future. She states she cant see it and can add the test. She states results take one day to be resulted. PCP aware.

## 2013-05-28 LAB — ROCKY MTN SPOTTED FVR AB, IGM-BLOOD: ROCKY MTN SPOTTED FEVER, IGM: 0.12 IV

## 2013-09-19 ENCOUNTER — Other Ambulatory Visit: Payer: Self-pay

## 2013-12-16 ENCOUNTER — Other Ambulatory Visit: Payer: Managed Care, Other (non HMO)

## 2013-12-25 ENCOUNTER — Encounter: Payer: Self-pay | Admitting: Internal Medicine

## 2013-12-25 ENCOUNTER — Ambulatory Visit (INDEPENDENT_AMBULATORY_CARE_PROVIDER_SITE_OTHER): Payer: Managed Care, Other (non HMO) | Admitting: Internal Medicine

## 2013-12-25 ENCOUNTER — Ambulatory Visit: Payer: Managed Care, Other (non HMO)

## 2013-12-25 VITALS — BP 112/80 | HR 84 | Temp 98.1°F | Resp 16 | Ht 72.0 in | Wt 240.0 lb

## 2013-12-25 DIAGNOSIS — Z0000 Encounter for general adult medical examination without abnormal findings: Secondary | ICD-10-CM

## 2013-12-25 DIAGNOSIS — K219 Gastro-esophageal reflux disease without esophagitis: Secondary | ICD-10-CM

## 2013-12-25 DIAGNOSIS — Z23 Encounter for immunization: Secondary | ICD-10-CM

## 2013-12-25 DIAGNOSIS — Z2911 Encounter for prophylactic immunotherapy for respiratory syncytial virus (RSV): Secondary | ICD-10-CM

## 2013-12-25 DIAGNOSIS — E785 Hyperlipidemia, unspecified: Secondary | ICD-10-CM

## 2013-12-25 LAB — CBC WITH DIFFERENTIAL/PLATELET
Basophils Absolute: 0 10*3/uL (ref 0.0–0.1)
Basophils Relative: 0.6 % (ref 0.0–3.0)
EOS PCT: 2.4 % (ref 0.0–5.0)
Eosinophils Absolute: 0.1 10*3/uL (ref 0.0–0.7)
HCT: 44 % (ref 39.0–52.0)
Hemoglobin: 14.6 g/dL (ref 13.0–17.0)
LYMPHS ABS: 2.1 10*3/uL (ref 0.7–4.0)
Lymphocytes Relative: 36.1 % (ref 12.0–46.0)
MCHC: 33.1 g/dL (ref 30.0–36.0)
MCV: 89.5 fl (ref 78.0–100.0)
MONO ABS: 0.4 10*3/uL (ref 0.1–1.0)
Monocytes Relative: 6.3 % (ref 3.0–12.0)
NEUTROS ABS: 3.1 10*3/uL (ref 1.4–7.7)
Neutrophils Relative %: 54.6 % (ref 43.0–77.0)
PLATELETS: 214 10*3/uL (ref 150.0–400.0)
RBC: 4.91 Mil/uL (ref 4.22–5.81)
RDW: 13.6 % (ref 11.5–14.6)
WBC: 5.8 10*3/uL (ref 4.5–10.5)

## 2013-12-25 LAB — HEPATIC FUNCTION PANEL
ALK PHOS: 32 U/L — AB (ref 39–117)
ALT: 23 U/L (ref 0–53)
AST: 21 U/L (ref 0–37)
Albumin: 4.3 g/dL (ref 3.5–5.2)
BILIRUBIN DIRECT: 0.1 mg/dL (ref 0.0–0.3)
BILIRUBIN TOTAL: 1.2 mg/dL (ref 0.3–1.2)
TOTAL PROTEIN: 7.3 g/dL (ref 6.0–8.3)

## 2013-12-25 LAB — BASIC METABOLIC PANEL
BUN: 20 mg/dL (ref 6–23)
CALCIUM: 9.2 mg/dL (ref 8.4–10.5)
CO2: 25 mEq/L (ref 19–32)
Chloride: 107 mEq/L (ref 96–112)
Creatinine, Ser: 1 mg/dL (ref 0.4–1.5)
GFR: 78.72 mL/min (ref >60.00)
Glucose, Bld: 99 mg/dL (ref 70–99)
Potassium: 4.3 mEq/L (ref 3.5–5.1)
Sodium: 140 mEq/L (ref 135–145)

## 2013-12-25 LAB — SEDIMENTATION RATE
Sed Rate: 1 mm/hr (ref 0–16)
Sed Rate: 1 mm/hr (ref 0–16)

## 2013-12-25 LAB — URINALYSIS
Bilirubin Urine: NEGATIVE
HGB URINE DIPSTICK: NEGATIVE
Ketones, ur: NEGATIVE
Leukocytes, UA: NEGATIVE
Nitrite: NEGATIVE
Specific Gravity, Urine: >=1.03 — AB (ref 1.000–1.030)
TOTAL PROTEIN, URINE-UPE24: NEGATIVE
URINE GLUCOSE: NEGATIVE
Urobilinogen, UA: 0.2 (ref 0.0–1.0)
pH: 5.5 (ref 5.0–8.0)

## 2013-12-25 LAB — TSH: TSH: 1.16 u[IU]/mL (ref 0.35–5.50)

## 2013-12-25 MED ORDER — PRAVASTATIN SODIUM 40 MG PO TABS: 40.0000 mg | ORAL_TABLET | Freq: Every day | ORAL | Status: DC

## 2013-12-25 NOTE — Assessment & Plan Note (Signed)
We discussed age appropriate health related issues, including available/recomended screening tests and vaccinations. We discussed a need for adhering to healthy diet and exercise. Labs/EKG were reviewed/ordered. All questions were answered.   

## 2013-12-25 NOTE — Progress Notes (Signed)
Pre visit review using our clinic review tool, if applicable. No additional management support is needed unless otherwise documented below in the visit note. 

## 2013-12-25 NOTE — Assessment & Plan Note (Signed)
Continue with current prescription therapy as reflected on the Med list.  

## 2013-12-31 ENCOUNTER — Encounter: Payer: Self-pay | Admitting: Internal Medicine

## 2013-12-31 NOTE — Assessment & Plan Note (Signed)
Continue with current prescription therapy as reflected on the Med list.  

## 2013-12-31 NOTE — Progress Notes (Signed)
Subjective:     HPI  The patient is here for a wellness exam. The patient has been doing well overall without major physical or psychological issues going on lately. The patient needs to address  chronic hypertension that has been well controlled with medicines; to address chronic  hyperlipidemia controlled with medicines as well No cramps in the muscles No palpitations He retired last year  BP Readings from Last 3 Encounters:  12/25/13 112/80  05/20/13 118/78  12/14/12 118/78   Wt Readings from Last 3 Encounters:  12/25/13 240 lb (108.863 kg)  05/20/13 247 lb (112.038 kg)  12/14/12 250 lb (113.399 kg)     Review of Systems  Constitutional: Negative for appetite change, fatigue and unexpected weight change.  HENT: Negative for nosebleeds, congestion, sore throat, sneezing, trouble swallowing and neck pain.   Eyes: Negative for itching and visual disturbance.  Respiratory: Negative for cough.   Cardiovascular: Negative for chest pain, palpitations and leg swelling.  Gastrointestinal: Negative for nausea, diarrhea, blood in stool and abdominal distention.  Genitourinary: Negative for frequency and hematuria.  Musculoskeletal: Negative for back pain, joint swelling and gait problem.  Skin: Negative for rash.  Neurological: Negative for dizziness, tremors, speech difficulty and weakness.  Psychiatric/Behavioral: Positive for sleep disturbance and dysphoric mood. Negative for suicidal ideas and agitation. The patient is not nervous/anxious.    BP 112/80  Pulse 84  Temp(Src) 98.1 F (36.7 C) (Oral)  Resp 16  Ht 6' (1.829 m)  Wt 240 lb (108.863 kg)  BMI 32.54 kg/m2     Objective:   Physical Exam  Constitutional: He is oriented to person, place, and time. He appears well-developed.  HENT:  Mouth/Throat: Oropharynx is clear and moist.  Eyes: Conjunctivae are normal. Pupils are equal, round, and reactive to light.  Neck: Normal range of motion. No JVD present. No  thyromegaly present.  Cardiovascular: Normal rate, regular rhythm, normal heart sounds and intact distal pulses.  Exam reveals no gallop and no friction rub.   1/6 murmur heard. Pulmonary/Chest: Effort normal and breath sounds normal. No respiratory distress. He has no wheezes. He has no rales. He exhibits no tenderness.  Abdominal: Soft. Bowel sounds are normal. He exhibits no distension and no mass. There is no tenderness. There is no rebound and no guarding.  Musculoskeletal: Normal range of motion. He exhibits no edema and no tenderness.  Lymphadenopathy:    He has no cervical adenopathy.  Neurological: He is alert and oriented to person, place, and time. He has normal reflexes. No cranial nerve deficit. He exhibits normal muscle tone. Coordination normal.  Skin: Skin is warm and dry. No rash noted.  Psychiatric: He has a normal mood and affect. His behavior is normal. Judgment and thought content normal.   Lab Results  Value Date   Lab Results  Component Value Date   WBC 5.8 12/25/2013   HGB 14.6 12/25/2013   HCT 44.0 12/25/2013   PLT 214.0 12/25/2013   GLUCOSE 99 12/25/2013   CHOL 160 12/06/2012   TRIG 103.0 12/06/2012   HDL 43.60 12/06/2012   LDLCALC 96 12/06/2012   ALT 23 12/25/2013   AST 21 12/25/2013   NA 140 12/25/2013   K 4.3 12/25/2013   CL 107 12/25/2013   CREATININE 1.0 12/25/2013   BUN 20 12/25/2013   CO2 25 12/25/2013   TSH 1.16 12/25/2013   PSA 0.79 12/06/2012   HGBA1C 5.9 12/06/2012           Assessment &  Plan:    Subjective:       Assessment & Plan:

## 2014-01-04 ENCOUNTER — Other Ambulatory Visit: Payer: Self-pay | Admitting: Internal Medicine

## 2014-01-21 ENCOUNTER — Encounter: Payer: Self-pay | Admitting: Internal Medicine

## 2014-02-07 NOTE — Telephone Encounter (Signed)
Phone call from patient stating all of his labs from his 12/25/13 he has not been able to view on his my chart. He would like those results.   Also patient states he brought in a form from his insurance that needs filling out. He would like to know the status on that.

## 2014-02-13 ENCOUNTER — Other Ambulatory Visit: Payer: Self-pay | Admitting: Internal Medicine

## 2014-02-13 NOTE — Telephone Encounter (Signed)
Refill done.  

## 2014-04-08 ENCOUNTER — Other Ambulatory Visit (INDEPENDENT_AMBULATORY_CARE_PROVIDER_SITE_OTHER): Payer: Managed Care, Other (non HMO)

## 2014-04-08 DIAGNOSIS — Z0000 Encounter for general adult medical examination without abnormal findings: Secondary | ICD-10-CM

## 2014-04-08 LAB — LIPID PANEL
CHOL/HDL RATIO: 3
CHOLESTEROL: 162 mg/dL (ref 0–200)
HDL: 49.2 mg/dL (ref >39.00)
LDL CALC: 100 mg/dL — AB (ref 0–99)
TRIGLYCERIDES: 64 mg/dL (ref 0.0–149.0)
VLDL: 12.8 mg/dL (ref 0.0–40.0)

## 2014-04-08 LAB — PSA: PSA: 0.82 ng/mL (ref 0.10–4.00)

## 2014-05-01 ENCOUNTER — Encounter: Payer: Self-pay | Admitting: Internal Medicine

## 2014-12-18 ENCOUNTER — Other Ambulatory Visit: Payer: Self-pay | Admitting: *Deleted

## 2014-12-19 ENCOUNTER — Telehealth: Payer: Self-pay | Admitting: Internal Medicine

## 2014-12-19 ENCOUNTER — Other Ambulatory Visit: Payer: Self-pay | Admitting: Internal Medicine

## 2014-12-19 NOTE — Telephone Encounter (Signed)
Done. See meds.  

## 2014-12-19 NOTE — Telephone Encounter (Signed)
Pt request refill for pravastatin (PRAVACHOL) 40 MG tablet to be send to [facility].

## 2014-12-25 ENCOUNTER — Other Ambulatory Visit: Payer: Self-pay | Admitting: *Deleted

## 2014-12-25 ENCOUNTER — Other Ambulatory Visit (INDEPENDENT_AMBULATORY_CARE_PROVIDER_SITE_OTHER): Payer: Managed Care, Other (non HMO)

## 2014-12-25 DIAGNOSIS — Z0000 Encounter for general adult medical examination without abnormal findings: Secondary | ICD-10-CM

## 2014-12-25 LAB — CBC WITH DIFFERENTIAL/PLATELET
BASOS ABS: 0 10*3/uL (ref 0.0–0.1)
Basophils Relative: 0.7 % (ref 0.0–3.0)
Eosinophils Absolute: 0.1 10*3/uL (ref 0.0–0.7)
Eosinophils Relative: 2.3 % (ref 0.0–5.0)
HCT: 41.5 % (ref 39.0–52.0)
Hemoglobin: 14.4 g/dL (ref 13.0–17.0)
LYMPHS PCT: 33.5 % (ref 12.0–46.0)
Lymphs Abs: 1.8 10*3/uL (ref 0.7–4.0)
MCHC: 34.6 g/dL (ref 30.0–36.0)
MCV: 86.8 fl (ref 78.0–100.0)
Monocytes Absolute: 0.4 10*3/uL (ref 0.1–1.0)
Monocytes Relative: 8 % (ref 3.0–12.0)
Neutro Abs: 3 10*3/uL (ref 1.4–7.7)
Neutrophils Relative %: 55.5 % (ref 43.0–77.0)
Platelets: 197 10*3/uL (ref 150.0–400.0)
RBC: 4.78 Mil/uL (ref 4.22–5.81)
RDW: 14.2 % (ref 11.5–15.5)
WBC: 5.4 10*3/uL (ref 4.0–10.5)

## 2014-12-25 LAB — HEPATIC FUNCTION PANEL
ALT: 19 U/L (ref 0–53)
AST: 18 U/L (ref 0–37)
Albumin: 4.2 g/dL (ref 3.5–5.2)
Alkaline Phosphatase: 33 U/L — ABNORMAL LOW (ref 39–117)
BILIRUBIN TOTAL: 0.8 mg/dL (ref 0.2–1.2)
Bilirubin, Direct: 0.2 mg/dL (ref 0.0–0.3)
Total Protein: 6.4 g/dL (ref 6.0–8.3)

## 2014-12-25 LAB — URINALYSIS, ROUTINE W REFLEX MICROSCOPIC
Bilirubin Urine: NEGATIVE
HGB URINE DIPSTICK: NEGATIVE
Ketones, ur: NEGATIVE
Leukocytes, UA: NEGATIVE
Nitrite: NEGATIVE
PH: 6 (ref 5.0–8.0)
RBC / HPF: NONE SEEN (ref 0–?)
SPECIFIC GRAVITY, URINE: 1.025 (ref 1.000–1.030)
TOTAL PROTEIN, URINE-UPE24: NEGATIVE
Urine Glucose: NEGATIVE
Urobilinogen, UA: 0.2 (ref 0.0–1.0)

## 2014-12-25 LAB — LIPID PANEL
CHOL/HDL RATIO: 3
Cholesterol: 165 mg/dL (ref 0–200)
HDL: 49.9 mg/dL (ref >39.00)
LDL CALC: 97 mg/dL (ref 0–99)
NONHDL: 115.1
Triglycerides: 92 mg/dL (ref 0.0–149.0)
VLDL: 18.4 mg/dL (ref 0.0–40.0)

## 2014-12-25 LAB — BASIC METABOLIC PANEL
BUN: 22 mg/dL (ref 6–23)
CHLORIDE: 105 meq/L (ref 96–112)
CO2: 29 meq/L (ref 19–32)
Calcium: 9.2 mg/dL (ref 8.4–10.5)
Creatinine, Ser: 1.02 mg/dL (ref 0.40–1.50)
GFR: 79.33 mL/min (ref >60.00)
Glucose, Bld: 108 mg/dL — ABNORMAL HIGH (ref 70–99)
POTASSIUM: 5 meq/L (ref 3.5–5.1)
SODIUM: 139 meq/L (ref 135–145)

## 2014-12-25 LAB — TSH: TSH: 1.46 u[IU]/mL (ref 0.35–4.50)

## 2014-12-25 LAB — PSA: PSA: 0.88 ng/mL (ref 0.10–4.00)

## 2014-12-29 ENCOUNTER — Encounter: Payer: Managed Care, Other (non HMO) | Admitting: Internal Medicine

## 2015-01-06 ENCOUNTER — Encounter: Payer: Self-pay | Admitting: Internal Medicine

## 2015-01-06 ENCOUNTER — Ambulatory Visit (INDEPENDENT_AMBULATORY_CARE_PROVIDER_SITE_OTHER): Payer: Managed Care, Other (non HMO) | Admitting: Internal Medicine

## 2015-01-06 VITALS — BP 118/64 | HR 61 | Temp 98.3°F | Ht 72.0 in | Wt 244.5 lb

## 2015-01-06 DIAGNOSIS — R071 Chest pain on breathing: Secondary | ICD-10-CM

## 2015-01-06 DIAGNOSIS — Z0000 Encounter for general adult medical examination without abnormal findings: Secondary | ICD-10-CM

## 2015-01-06 DIAGNOSIS — M25561 Pain in right knee: Secondary | ICD-10-CM | POA: Insufficient documentation

## 2015-01-06 DIAGNOSIS — R0789 Other chest pain: Secondary | ICD-10-CM | POA: Insufficient documentation

## 2015-01-06 MED ORDER — PRAVASTATIN SODIUM 40 MG PO TABS: 40.0000 mg | ORAL_TABLET | Freq: Every day | ORAL | Status: DC

## 2015-01-06 MED ORDER — OMEPRAZOLE 20 MG PO CPDR: 40.0000 mg | DELAYED_RELEASE_CAPSULE | Freq: Every day | ORAL | Status: DC

## 2015-01-06 NOTE — Assessment & Plan Note (Signed)
Arthroscopy is being contemplated

## 2015-01-06 NOTE — Assessment & Plan Note (Signed)
2/16 R upper chest contusion

## 2015-01-06 NOTE — Assessment & Plan Note (Signed)
We discussed age appropriate health related issues, including available/recomended screening tests and vaccinations. We discussed a need for adhering to healthy diet and exercise. Labs/EKG were reviewed/ordered. All questions were answered.   

## 2015-01-06 NOTE — Progress Notes (Signed)
Subjective:     HPI  The patient is here for a wellness exam. The patient  [place] on the R side 2 wks ago - c/o R anterior CP. C/o a scab on the scalp. C/o R knee pain  -- seeing Ortho  The patient needs to address  chronic hypertension that has been well controlled with medicines; to address chronic  hyperlipidemia controlled with medicines as well No cramps in the muscles No palpitations He retired 2 years ago  BP Readings from Last 3 Encounters:  01/06/15 118/64  12/25/13 112/80  05/20/13 118/78   Wt Readings from Last 3 Encounters:  01/06/15 244 lb 8 oz (110.904 kg)  12/25/13 240 lb (108.863 kg)  05/20/13 247 lb (112.038 kg)     Review of Systems  Constitutional: Negative for appetite change, fatigue and unexpected weight change.  HENT: Negative for nosebleeds, congestion, sore throat, sneezing, trouble swallowing and neck pain.   Eyes: Negative for itching and visual disturbance.  Respiratory: Negative for cough.   Cardiovascular: Negative for chest pain, palpitations and leg swelling.  Gastrointestinal: Negative for nausea, diarrhea, blood in stool and abdominal distention.  Genitourinary: Negative for frequency and hematuria.  Musculoskeletal: Negative for back pain, joint swelling and gait problem.  Skin: Negative for rash.  Neurological: Negative for dizziness, tremors, speech difficulty and weakness.  Psychiatric/Behavioral: Positive for sleep disturbance and dysphoric mood. Negative for suicidal ideas and agitation. The patient is not nervous/anxious.    BP 118/64 mmHg  Pulse 61  Temp(Src) 98.3 F (36.8 C) (Oral)  Ht 6' (1.829 m)  Wt 244 lb 8 oz (110.904 kg)  BMI 33.15 kg/m2  SpO2 96%     Objective:   Physical Exam  Constitutional: He is oriented to person, place, and time. He appears well-developed.  HENT:  Mouth/Throat: Oropharynx is clear and moist.  Eyes: Conjunctivae are normal. Pupils are equal, round, and reactive to light.  Neck:  Normal range of motion. No JVD present. No thyromegaly present.  Cardiovascular: Normal rate, regular rhythm, normal heart sounds and intact distal pulses.  Exam reveals no gallop and no friction rub.   1/6 murmur heard. Pulmonary/Chest: Effort normal and breath sounds normal. No respiratory distress. He has no wheezes. He has no rales. He exhibits no tenderness.  Abdominal: Soft. Bowel sounds are normal. He exhibits no distension and no mass. There is no tenderness. There is no rebound and no guarding.  Rectal: prostate WNL, G(-) stool Musculoskeletal: Normal range of motion. R anter upper chest is sensitive to deep palpation Lymphadenopathy:    He has no cervical adenopathy.  Neurological: He is alert and oriented to person, place, and time. He has normal reflexes. No cranial nerve deficit. He exhibits normal muscle tone. Coordination normal.  Skin: Skin is warm and dry. No rash noted. No lesions on scalp noted today Psychiatric: He has a normal mood and affect. His behavior is normal. Judgment and thought content normal.   Lab Results  Value Date   Lab Results  Component Value Date   WBC 5.4 12/25/2014   HGB 14.4 12/25/2014   HCT 41.5 12/25/2014   PLT 197.0 12/25/2014   GLUCOSE 108* 12/25/2014   CHOL 165 12/25/2014   TRIG 92.0 12/25/2014   HDL 49.90 12/25/2014   LDLCALC 97 12/25/2014   ALT 19 12/25/2014   AST 18 12/25/2014   NA 139 12/25/2014   K 5.0 12/25/2014   CL 105 12/25/2014   CREATININE 1.02 12/25/2014   BUN  22 12/25/2014   CO2 29 12/25/2014   TSH 1.46 12/25/2014   PSA 0.88 12/25/2014   HGBA1C 5.9 12/06/2012           Assessment & Plan:    Subjective:       Assessment & Plan:      Patient ID: Joe Williams, male   DOB: 1955/12/16, 59 y.o.   MRN: [id number]

## 2015-01-06 NOTE — Progress Notes (Signed)
Pre visit review using our clinic review tool, if applicable. No additional management support is needed unless otherwise documented below in the visit note. 

## 2015-02-17 ENCOUNTER — Telehealth: Payer: Self-pay | Admitting: Internal Medicine

## 2015-02-17 ENCOUNTER — Other Ambulatory Visit: Payer: Self-pay | Admitting: Internal Medicine

## 2015-02-17 MED ORDER — OMEPRAZOLE 20 MG PO CPDR: 40.0000 mg | DELAYED_RELEASE_CAPSULE | Freq: Every day | ORAL | Status: DC

## 2015-02-17 NOTE — Telephone Encounter (Signed)
Patient needs refill on omeprazole (PRILOSEC) 20 MG capsule [[id number].

## 2015-02-17 NOTE — Telephone Encounter (Signed)
Refill [facility] to [facility] 

## 2015-03-19 ENCOUNTER — Other Ambulatory Visit: Payer: Self-pay | Admitting: Internal Medicine

## 2015-12-04 ENCOUNTER — Telehealth: Payer: Self-pay | Admitting: Internal Medicine

## 2015-12-04 DIAGNOSIS — Z0000 Encounter for general adult medical examination without abnormal findings: Secondary | ICD-10-CM

## 2015-12-04 NOTE — Telephone Encounter (Signed)
i have entered lab orders per patient request for cpe--tried to contact patient, and no way to leave message/no voice mailbox set up on phone

## 2015-12-04 NOTE — Telephone Encounter (Signed)
Patient requesting for CPE labs to be placed. He wants to have done before the visit. States he has verified that [place] will cover it

## 2016-01-05 ENCOUNTER — Other Ambulatory Visit (INDEPENDENT_AMBULATORY_CARE_PROVIDER_SITE_OTHER): Payer: Managed Care, Other (non HMO)

## 2016-01-05 DIAGNOSIS — Z0000 Encounter for general adult medical examination without abnormal findings: Secondary | ICD-10-CM | POA: Diagnosis not present

## 2016-01-05 LAB — BASIC METABOLIC PANEL
BUN: 21 mg/dL (ref 6–23)
CHLORIDE: 106 meq/L (ref 96–112)
CO2: 28 mEq/L (ref 19–32)
CREATININE: 0.99 mg/dL (ref 0.40–1.50)
Calcium: 9.1 mg/dL (ref 8.4–10.5)
GFR: 81.83 mL/min (ref >60.00)
Glucose, Bld: 111 mg/dL — ABNORMAL HIGH (ref 70–99)
POTASSIUM: 4.6 meq/L (ref 3.5–5.1)
Sodium: 139 mEq/L (ref 135–145)

## 2016-01-05 LAB — HEPATIC FUNCTION PANEL
ALT: 16 U/L (ref 0–53)
AST: 17 U/L (ref 0–37)
Albumin: 4.2 g/dL (ref 3.5–5.2)
Alkaline Phosphatase: 28 U/L — ABNORMAL LOW (ref 39–117)
BILIRUBIN DIRECT: 0.1 mg/dL (ref 0.0–0.3)
TOTAL PROTEIN: 6.3 g/dL (ref 6.0–8.3)
Total Bilirubin: 0.6 mg/dL (ref 0.2–1.2)

## 2016-01-05 LAB — URINALYSIS, ROUTINE W REFLEX MICROSCOPIC
BILIRUBIN URINE: NEGATIVE
HGB URINE DIPSTICK: NEGATIVE
KETONES UR: NEGATIVE
Leukocytes, UA: NEGATIVE
Nitrite: NEGATIVE
RBC / HPF: NONE SEEN (ref 0–?)
Specific Gravity, Urine: >=1.03 — AB (ref 1.000–1.030)
Total Protein, Urine: NEGATIVE
Urine Glucose: NEGATIVE
Urobilinogen, UA: 0.2 (ref 0.0–1.0)
WBC UA: NONE SEEN (ref 0–?)
pH: 5.5 (ref 5.0–8.0)

## 2016-01-05 LAB — LIPID PANEL
CHOLESTEROL: 144 mg/dL (ref 0–200)
HDL: 45.4 mg/dL (ref >39.00)
LDL CALC: 82 mg/dL (ref 0–99)
NonHDL: 99.09
TRIGLYCERIDES: 85 mg/dL (ref 0.0–149.0)
Total CHOL/HDL Ratio: 3
VLDL: 17 mg/dL (ref 0.0–40.0)

## 2016-01-05 LAB — CBC WITH DIFFERENTIAL/PLATELET
BASOS ABS: 0 10*3/uL (ref 0.0–0.1)
BASOS PCT: 1.1 % (ref 0.0–3.0)
EOS ABS: 0.2 10*3/uL (ref 0.0–0.7)
Eosinophils Relative: 4.3 % (ref 0.0–5.0)
HEMATOCRIT: 40.8 % (ref 39.0–52.0)
Hemoglobin: 13.7 g/dL (ref 13.0–17.0)
LYMPHS PCT: 42.6 % (ref 12.0–46.0)
Lymphs Abs: 1.7 10*3/uL (ref 0.7–4.0)
MCHC: 33.5 g/dL (ref 30.0–36.0)
MCV: 87 fl (ref 78.0–100.0)
MONO ABS: 0.3 10*3/uL (ref 0.1–1.0)
Monocytes Relative: 7.5 % (ref 3.0–12.0)
NEUTROS ABS: 1.8 10*3/uL (ref 1.4–7.7)
NEUTROS PCT: 44.5 % (ref 43.0–77.0)
PLATELETS: 200 10*3/uL (ref 150.0–400.0)
RBC: 4.7 Mil/uL (ref 4.22–5.81)
RDW: 13.5 % (ref 11.5–15.5)
WBC: 4 10*3/uL (ref 4.0–10.5)

## 2016-01-05 LAB — TSH: TSH: 1.12 u[IU]/mL (ref 0.35–4.50)

## 2016-01-05 LAB — PSA: PSA: 1.05 ng/mL (ref 0.10–4.00)

## 2016-01-12 ENCOUNTER — Ambulatory Visit (INDEPENDENT_AMBULATORY_CARE_PROVIDER_SITE_OTHER): Payer: Managed Care, Other (non HMO) | Admitting: Internal Medicine

## 2016-01-12 ENCOUNTER — Encounter: Payer: Self-pay | Admitting: Internal Medicine

## 2016-01-12 VITALS — BP 120/70 | HR 63 | Ht 72.0 in | Wt 249.0 lb

## 2016-01-12 DIAGNOSIS — Z0000 Encounter for general adult medical examination without abnormal findings: Secondary | ICD-10-CM

## 2016-01-12 DIAGNOSIS — E785 Hyperlipidemia, unspecified: Secondary | ICD-10-CM

## 2016-01-12 DIAGNOSIS — R3915 Urgency of urination: Secondary | ICD-10-CM | POA: Diagnosis not present

## 2016-01-12 DIAGNOSIS — R739 Hyperglycemia, unspecified: Secondary | ICD-10-CM

## 2016-01-12 DIAGNOSIS — Z1211 Encounter for screening for malignant neoplasm of colon: Secondary | ICD-10-CM

## 2016-01-12 MED ORDER — OMEPRAZOLE 40 MG PO CPDR: 40.0000 mg | DELAYED_RELEASE_CAPSULE | Freq: Every day | ORAL | Status: DC

## 2016-01-12 MED ORDER — PRAVASTATIN SODIUM 40 MG PO TABS: 40.0000 mg | ORAL_TABLET | Freq: Every day | ORAL | Status: DC

## 2016-01-12 NOTE — Assessment & Plan Note (Signed)
Labs

## 2016-01-12 NOTE — Assessment & Plan Note (Signed)
We discussed age appropriate health related issues, including available/recomended screening tests and vaccinations. We discussed a need for adhering to healthy diet and exercise. Labs/EKG were reviewed/ordered. All questions were answered.   

## 2016-01-12 NOTE — Assessment & Plan Note (Signed)
Monitor wt Labs

## 2016-01-12 NOTE — Progress Notes (Signed)
Pre visit review using our clinic review tool, if applicable. No additional management support is needed unless otherwise documented below in the visit note. 

## 2016-01-12 NOTE — Progress Notes (Signed)
Subjective:  Patient ID: Joe Williams, male    DOB: 1956/06/08  Age: 60 y.o. MRN: [id number]  CC: Annual Exam   HPI JAKAYLEN FABER presents for well exam  Outpatient Prescriptions Prior to Visit  Medication Sig Dispense Refill  . aspirin EC 81 MG tablet Take 81 mg by mouth daily.      . cholecalciferol (VITAMIN D) 1000 UNITS tablet Take 1,000 Units by mouth daily.      . meloxicam (MOBIC) 15 MG tablet Take 15 mg by mouth daily as needed for pain.    [name] POTASSIUM GLUCONATE PO Take 1 tablet by mouth daily.    . Saw Palmetto 160 MG TABS Take 1 tablet by mouth daily.    . vitamin C (ASCORBIC ACID) 500 MG tablet Take 500 mg by mouth daily.    [name] omeprazole (PRILOSEC) 20 MG capsule Take 2 capsules (40 mg total) by mouth daily. 180 capsule 3  . pravastatin (PRAVACHOL) 40 MG tablet Take 1 tablet (40 mg total) by mouth daily. 90 tablet 3   No facility-administered medications prior to visit.    ROS Review of Systems  Constitutional: Negative for appetite change, fatigue and unexpected weight change.  HENT: Negative for congestion, nosebleeds, sneezing, sore throat and trouble swallowing.   Eyes: Negative for itching and visual disturbance.  Respiratory: Negative for cough.   Cardiovascular: Negative for chest pain, palpitations and leg swelling.  Gastrointestinal: Negative for nausea, diarrhea, blood in stool and abdominal distention.  Genitourinary: Positive for urgency. Negative for frequency, hematuria, discharge, difficulty urinating and testicular pain.  Musculoskeletal: Positive for arthralgias. Negative for back pain, joint swelling, gait problem and neck pain.  Skin: Negative for rash.  Neurological: Negative for dizziness, tremors, speech difficulty and weakness.  Psychiatric/Behavioral: Negative for suicidal ideas, sleep disturbance, dysphoric mood and agitation. The patient is not nervous/anxious.     Objective:  BP 120/70 mmHg  Pulse 63  Ht 6' (1.829 m)  Wt 249 lb  (112.946 kg)  BMI 33.76 kg/m2  SpO2 96%  BP Readings from Last 3 Encounters:  01/12/16 120/70  01/06/15 118/64  12/25/13 112/80    Wt Readings from Last 3 Encounters:  01/12/16 249 lb (112.946 kg)  01/06/15 244 lb 8 oz (110.904 kg)  12/25/13 240 lb (108.863 kg)    Physical Exam  Constitutional: He is oriented to person, place, and time. He appears well-developed and well-nourished. No distress.  NAD  HENT:  Head: Normocephalic and atraumatic.  Right Ear: External ear normal.  Left Ear: External ear normal.  Nose: Nose normal.  Mouth/Throat: Oropharynx is clear and moist. No oropharyngeal exudate.  Eyes: Conjunctivae and EOM are normal. Pupils are equal, round, and reactive to light. Right eye exhibits no discharge. Left eye exhibits no discharge. No scleral icterus.  Neck: Normal range of motion. Neck supple. No JVD present. No tracheal deviation present. No thyromegaly present.  Cardiovascular: Normal rate, regular rhythm, normal heart sounds and intact distal pulses.  Exam reveals no gallop and no friction rub.   No murmur heard. Pulmonary/Chest: Effort normal and breath sounds normal. No stridor. No respiratory distress. He has no wheezes. He has no rales. He exhibits no tenderness.  Abdominal: Soft. Bowel sounds are normal. He exhibits no distension and no mass. There is no tenderness. There is no rebound and no guarding.  Genitourinary: Rectum normal and penis normal. Guaiac negative stool. No penile tenderness.  Musculoskeletal: Normal range of motion. He exhibits no edema or tenderness.  Lymphadenopathy:    He has no cervical adenopathy.  Neurological: He is alert and oriented to person, place, and time. He has normal reflexes. No cranial nerve deficit. He exhibits normal muscle tone. He displays a negative Romberg sign. Coordination and gait normal.  Skin: Skin is warm and dry. No rash noted. He is not diaphoretic. No erythema. No pallor.  Psychiatric: He has a normal  mood and affect. His behavior is normal. Judgment and thought content normal.  prostate 1+ R sterno-clav is NT, enlarged a little  Lab Results  Component Value Date   WBC 4.0 01/05/2016   HGB 13.7 01/05/2016   HCT 40.8 01/05/2016   PLT 200.0 01/05/2016   GLUCOSE 111* 01/05/2016   CHOL 144 01/05/2016   TRIG 85.0 01/05/2016   HDL 45.40 01/05/2016   LDLCALC 82 01/05/2016   ALT 16 01/05/2016   AST 17 01/05/2016   NA 139 01/05/2016   K 4.6 01/05/2016   CL 106 01/05/2016   CREATININE 0.99 01/05/2016   BUN 21 01/05/2016   CO2 28 01/05/2016   TSH 1.12 01/05/2016   PSA 1.05 01/05/2016   HGBA1C 5.9 12/06/2012    Dg Chest 2 View  10/25/2011  *RADIOLOGY REPORT* Clinical Data: Cough and shortness of breath.  Chest pain for 3 weeks.  Nonsmoker. CHEST - 2 VIEW Comparison: None. Findings: Midline trachea.  Normal heart size and mediastinal contours. No pleural effusion or pneumothorax.  Clear lungs. IMPRESSION: No acute cardiopulmonary disease. Original Report Authenticated By: Areta Haber, M.D.   Assessment & Plan:   Nakita was seen today for annual exam.  Diagnoses and all orders for this visit:  Well adult exam -     Hepatic function panel; Future -     Hemoglobin A1c; Future -     Basic metabolic panel; Future -     CBC with Differential/Platelet; Future -     Hepatitis C antibody; Future -     Lipid panel; Future -     PSA; Future -     TSH; Future -     Urinalysis; Future  Hyperglycemia -     Hemoglobin A1c; Future  Dyslipidemia  Urinary urgency  Colon cancer screening -     Ambulatory referral to Gastroenterology  Other orders -     omeprazole (PRILOSEC) 40 MG capsule; Take 1 capsule (40 mg total) by mouth daily. -     pravastatin (PRAVACHOL) 40 MG tablet; Take 1 tablet (40 mg total) by mouth daily.  I have discontinued Mr. Lamoreaux's omeprazole. I am also having him start on omeprazole. Additionally, I am having him maintain his aspirin EC, cholecalciferol,  vitamin C, POTASSIUM GLUCONATE PO, Saw Palmetto, meloxicam, and pravastatin.  Meds ordered this encounter  Medications  . omeprazole (PRILOSEC) 40 MG capsule    Sig: Take 1 capsule (40 mg total) by mouth daily.    Dispense:  90 capsule    Refill:  3  . pravastatin (PRAVACHOL) 40 MG tablet    Sig: Take 1 tablet (40 mg total) by mouth daily.    Dispense:  90 tablet    Refill:  3     Follow-up: Return in about 1 year (around 01/11/2017) for Wellness Exam.  Walker Kehr, MD

## 2016-02-27 ENCOUNTER — Other Ambulatory Visit: Payer: Self-pay | Admitting: Internal Medicine

## 2016-03-01 ENCOUNTER — Other Ambulatory Visit: Payer: Self-pay | Admitting: Internal Medicine

## 2016-08-24 ENCOUNTER — Encounter: Payer: Self-pay | Admitting: Internal Medicine

## 2016-09-26 ENCOUNTER — Encounter: Payer: Self-pay | Admitting: Internal Medicine

## 2016-11-29 ENCOUNTER — Ambulatory Visit (AMBULATORY_SURGERY_CENTER): Payer: Self-pay

## 2016-11-29 VITALS — Ht 72.0 in | Wt 253.0 lb

## 2016-11-29 DIAGNOSIS — Z1211 Encounter for screening for malignant neoplasm of colon: Secondary | ICD-10-CM

## 2016-11-29 NOTE — Progress Notes (Signed)
No allergies to eggs or soy No past problems with anesthesia No home oxygen No diet meds  Declined emmi 

## 2016-11-30 ENCOUNTER — Encounter: Payer: Self-pay | Admitting: Internal Medicine

## 2016-12-13 ENCOUNTER — Ambulatory Visit (AMBULATORY_SURGERY_CENTER): Payer: Managed Care, Other (non HMO) | Admitting: Internal Medicine

## 2016-12-13 ENCOUNTER — Encounter: Payer: Self-pay | Admitting: Internal Medicine

## 2016-12-13 VITALS — BP 126/84 | HR 56 | Temp 98.4°F | Resp 11 | Ht 72.0 in | Wt 253.0 lb

## 2016-12-13 DIAGNOSIS — D125 Benign neoplasm of sigmoid colon: Secondary | ICD-10-CM

## 2016-12-13 DIAGNOSIS — K635 Polyp of colon: Secondary | ICD-10-CM | POA: Diagnosis not present

## 2016-12-13 DIAGNOSIS — Z1212 Encounter for screening for malignant neoplasm of rectum: Secondary | ICD-10-CM

## 2016-12-13 DIAGNOSIS — Z1211 Encounter for screening for malignant neoplasm of colon: Secondary | ICD-10-CM | POA: Diagnosis not present

## 2016-12-13 MED ORDER — SODIUM CHLORIDE 0.9 % IV SOLN: 500.0000 mL | INTRAVENOUS | Status: DC

## 2016-12-13 NOTE — Progress Notes (Signed)
Called to room to assist during endoscopic procedure.  Patient ID and intended procedure confirmed with present staff. Received instructions for my participation in the procedure from the performing physician.  

## 2016-12-13 NOTE — Patient Instructions (Addendum)
I found and removed and one polyp today that looks benign. I will let you know pathology results and when to have another routine colonoscopy by mail.  I also saw some internal hemorrhoids. If you have hemorrhoid problems (swelling, itching, bleeding) I am able to treat those with an in-office procedure. If you like, please call my office at [phone] to schedule an appointment and I can evaluate you further.  I appreciate the opportunity to care for you. Gatha Mayer, MD, FACG YOU HAD AN ENDOSCOPIC PROCEDURE TODAY AT [facility]:   Refer to the procedure report that was given to you for any specific questions about what was found during the examination.  If the procedure report does not answer your questions, please call your gastroenterologist to clarify.  If you requested that your care partner not be given the details of your procedure findings, then the procedure report has been included in a sealed envelope for you to review at your convenience later.  YOU SHOULD EXPECT: Some feelings of bloating in the abdomen. Passage of more gas than usual.  Walking can help get rid of the air that was put into your GI tract during the procedure and reduce the bloating. If you had a lower endoscopy (such as a colonoscopy or flexible sigmoidoscopy) you may notice spotting of blood in your stool or on the toilet paper. If you underwent a bowel prep for your procedure, you may not have a normal bowel movement for a few days.  Please Note:  You might notice some irritation and congestion in your nose or some drainage.  This is from the oxygen used during your procedure.  There is no need for concern and it should clear up in a day or so.  SYMPTOMS TO REPORT IMMEDIATELY:   Following lower endoscopy (colonoscopy or flexible sigmoidoscopy):  Excessive amounts of blood in the stool  Significant tenderness or worsening of abdominal pains  Swelling of the abdomen that is new,  acute  Fever of 100F or higher   For urgent or emergent issues, a gastroenterologist can be reached at any hour by calling [phone]. Please read all handouts given to you by your recovery nurse today.   DIET:  We do recommend a small meal at first, but then you may proceed to your regular diet.  Drink plenty of fluids but you should avoid alcoholic beverages for 24 hours.  ACTIVITY:  You should plan to take it easy for the rest of today and you should NOT DRIVE or use heavy machinery until tomorrow (because of the sedation medicines used during the test).    FOLLOW UP: Our staff will call the number listed on your records the next business day following your procedure to check on you and address any questions or concerns that you may have regarding the information given to you following your procedure. If we do not reach you, we will leave a message.  However, if you are feeling well and you are not experiencing any problems, there is no need to return our call.  We will assume that you have returned to your regular daily activities without incident.  If any biopsies were taken you will be contacted by phone or by letter within the next 1-3 weeks.  Please call us at [phone] if you have not heard about the biopsies in 3 weeks.    SIGNATURES/CONFIDENTIALITY: You and/or your care partner have signed paperwork which will be entered into  your electronic medical record.  These signatures attest to the fact that that the information above on your After Visit Summary has been reviewed and is understood.  Full responsibility of the confidentiality of this discharge information lies with you and/or your care-partner.  Thank you for letting us take care of your healthcare needs today.

## 2016-12-13 NOTE — Progress Notes (Signed)
To recovery vss report to [facility] RN

## 2016-12-13 NOTE — Op Note (Signed)
[place] Patient Name: Joe Williams Procedure Date: 12/13/2016 7:55 AM MRN: [id number] Endoscopist: Gatha Mayer , MD Age: 61 Referring MD:  Date of Birth: October 29, 1956 Gender: Male Account #: [id number] Procedure:                Colonoscopy Indications:              Screening for colorectal malignant neoplasm, Last                            colonoscopy: 2007 Medicines:                Propofol per Anesthesia, Monitored Anesthesia Care Procedure:                Pre-Anesthesia Assessment:                           - Prior to the procedure, a History and Physical                            was performed, and patient medications and                            allergies were reviewed. The patient's tolerance of                            previous anesthesia was also reviewed. The risks                            and benefits of the procedure and the sedation                            options and risks were discussed with the patient.                            All questions were answered, and informed consent                            was obtained. Prior Anticoagulants: The patient                            last took aspirin 1 day prior to the procedure. ASA                            Grade Assessment: II - A patient with mild systemic                            disease. After reviewing the risks and benefits,                            the patient was deemed in satisfactory condition to                            undergo the procedure.  After obtaining informed consent, the colonoscope                            was passed under direct vision. Throughout the                            procedure, the patient's blood pressure, pulse, and                            oxygen saturations were monitored continuously. The                            Model CF-HQ190L [id number] scope was introduced                            through the anus and advanced to  the the cecum,                            identified by appendiceal orifice and ileocecal                            valve. The colonoscopy was performed without                            difficulty. The patient tolerated the procedure                            well. The quality of the bowel preparation was                            good. The bowel preparation used was Miralax. The                            ileocecal valve, appendiceal orifice, and rectum                            were photographed. Scope In: 8:00:04 AM Scope Out: 8:17:29 AM Scope Withdrawal Time: 0 hours 14 minutes 43 seconds  Total Procedure Duration: 0 hours 17 minutes 25 seconds  Findings:                 The perianal and digital rectal examinations were                            normal. Pertinent negatives include normal prostate                            (size, shape, and consistency).                           A 4 mm polyp was found in the distal sigmoid colon.                            The polyp was sessile. The polyp was removed with a  cold snare. Resection and retrieval were complete.                            Verification of patient identification for the                            specimen was done. Estimated blood loss was minimal.                           Internal hemorrhoids were found during retroflexion.                           The exam was otherwise without abnormality on                            direct and retroflexion views. Complications:            No immediate complications. Estimated Blood Loss:     Estimated blood loss was minimal. Impression:               - One 4 mm polyp in the distal sigmoid colon,                            removed with a cold snare. Resected and retrieved.                           - Internal hemorrhoids.                           - The examination was otherwise normal on direct                            and retroflexion  views. Recommendation:           - Patient has a contact number available for                            emergencies. The signs and symptoms of potential                            delayed complications were discussed with the                            patient. Return to normal activities tomorrow.                            Written discharge instructions were provided to the                            patient.                           - Resume previous diet.                           - Continue present medications.                           -  Repeat colonoscopy is recommended. The                            colonoscopy date will be determined after pathology                            results from today's exam become available for                            review. Gatha Mayer, MD 12/13/2016 8:29:39 AM This report has been signed electronically.

## 2016-12-14 ENCOUNTER — Telehealth: Payer: Self-pay

## 2016-12-14 NOTE — Telephone Encounter (Signed)
  Follow up Call-  Call back number 12/13/2016  Post procedure Call Back phone  # [phone]  Permission to leave phone message Yes  Some recent data might be hidden     Patient questions:  Do you have a fever, pain , or abdominal swelling? No. Pain Score  0 *  Have you tolerated food without any problems? Yes.    Have you been able to return to your normal activities? Yes.    Do you have any questions about your discharge instructions: Diet   No. Medications  No. Follow up visit  No.  Do you have questions or concerns about your Care? No.  Actions: * If pain score is 4 or above: No action needed, pain <4.

## 2016-12-19 ENCOUNTER — Encounter: Payer: Self-pay | Admitting: Internal Medicine

## 2016-12-19 NOTE — Progress Notes (Signed)
Distal hyperplastic polyp 10 yr recall 2028 My Chart letter

## 2017-01-05 ENCOUNTER — Other Ambulatory Visit (INDEPENDENT_AMBULATORY_CARE_PROVIDER_SITE_OTHER): Payer: Managed Care, Other (non HMO)

## 2017-01-05 DIAGNOSIS — Z0000 Encounter for general adult medical examination without abnormal findings: Secondary | ICD-10-CM | POA: Diagnosis not present

## 2017-01-05 DIAGNOSIS — R739 Hyperglycemia, unspecified: Secondary | ICD-10-CM

## 2017-01-05 LAB — LIPID PANEL
CHOL/HDL RATIO: 3
Cholesterol: 161 mg/dL (ref 0–200)
HDL: 49.4 mg/dL (ref >39.00)
LDL Cholesterol: 98 mg/dL (ref 0–99)
NONHDL: 111.53
Triglycerides: 67 mg/dL (ref 0.0–149.0)
VLDL: 13.4 mg/dL (ref 0.0–40.0)

## 2017-01-05 LAB — CBC WITH DIFFERENTIAL/PLATELET
Basophils Absolute: 0 10*3/uL (ref 0.0–0.1)
Basophils Relative: 1.1 % (ref 0.0–3.0)
EOS PCT: 3.7 % (ref 0.0–5.0)
Eosinophils Absolute: 0.1 10*3/uL (ref 0.0–0.7)
HCT: 42 % (ref 39.0–52.0)
HEMOGLOBIN: 14.4 g/dL (ref 13.0–17.0)
LYMPHS ABS: 1.6 10*3/uL (ref 0.7–4.0)
Lymphocytes Relative: 39.9 % (ref 12.0–46.0)
MCHC: 34.3 g/dL (ref 30.0–36.0)
MCV: 87.5 fl (ref 78.0–100.0)
MONOS PCT: 8.4 % (ref 3.0–12.0)
Monocytes Absolute: 0.3 10*3/uL (ref 0.1–1.0)
NEUTROS PCT: 46.9 % (ref 43.0–77.0)
Neutro Abs: 1.9 10*3/uL (ref 1.4–7.7)
Platelets: 189 10*3/uL (ref 150.0–400.0)
RBC: 4.8 Mil/uL (ref 4.22–5.81)
RDW: 13.8 % (ref 11.5–15.5)
WBC: 4 10*3/uL (ref 4.0–10.5)

## 2017-01-05 LAB — HEPATIC FUNCTION PANEL
ALT: 25 U/L (ref 0–53)
AST: 21 U/L (ref 0–37)
Albumin: 4.6 g/dL (ref 3.5–5.2)
Alkaline Phosphatase: 33 U/L — ABNORMAL LOW (ref 39–117)
BILIRUBIN DIRECT: 0.2 mg/dL (ref 0.0–0.3)
TOTAL PROTEIN: 6.6 g/dL (ref 6.0–8.3)
Total Bilirubin: 0.8 mg/dL (ref 0.2–1.2)

## 2017-01-05 LAB — URINALYSIS
Bilirubin Urine: NEGATIVE
Hgb urine dipstick: NEGATIVE
Ketones, ur: NEGATIVE
Leukocytes, UA: NEGATIVE
Nitrite: NEGATIVE
PH: 6 (ref 5.0–8.0)
TOTAL PROTEIN, URINE-UPE24: NEGATIVE
URINE GLUCOSE: NEGATIVE
Urobilinogen, UA: 0.2 (ref 0.0–1.0)

## 2017-01-05 LAB — BASIC METABOLIC PANEL
BUN: 26 mg/dL — ABNORMAL HIGH (ref 6–23)
CALCIUM: 9.5 mg/dL (ref 8.4–10.5)
CO2: 28 mEq/L (ref 19–32)
Chloride: 107 mEq/L (ref 96–112)
Creatinine, Ser: 1 mg/dL (ref 0.40–1.50)
GFR: 80.62 mL/min (ref >60.00)
GLUCOSE: 114 mg/dL — AB (ref 70–99)
POTASSIUM: 4.8 meq/L (ref 3.5–5.1)
SODIUM: 141 meq/L (ref 135–145)

## 2017-01-05 LAB — HEMOGLOBIN A1C: HEMOGLOBIN A1C: 5.9 % (ref 4.6–6.5)

## 2017-01-05 LAB — PSA: PSA: 0.95 ng/mL (ref 0.10–4.00)

## 2017-01-06 LAB — TSH: TSH: 1.01 u[IU]/mL (ref 0.35–4.50)

## 2017-01-06 LAB — HEPATITIS C ANTIBODY: HCV Ab: NEGATIVE

## 2017-01-08 ENCOUNTER — Other Ambulatory Visit: Payer: Self-pay | Admitting: Internal Medicine

## 2017-01-12 ENCOUNTER — Encounter: Payer: Self-pay | Admitting: Internal Medicine

## 2017-01-12 ENCOUNTER — Ambulatory Visit (INDEPENDENT_AMBULATORY_CARE_PROVIDER_SITE_OTHER): Payer: 59 | Admitting: Internal Medicine

## 2017-01-12 ENCOUNTER — Ambulatory Visit (INDEPENDENT_AMBULATORY_CARE_PROVIDER_SITE_OTHER)
Admission: RE | Admit: 2017-01-12 | Discharge: 2017-01-12 | Disposition: A | Discharge status: Home / Self Care | Payer: 59 | Source: Ambulatory Visit | Attending: Internal Medicine | Admitting: Internal Medicine

## 2017-01-12 VITALS — BP 112/72 | HR 62 | Temp 98.0°F | Ht 72.0 in | Wt 248.1 lb

## 2017-01-12 DIAGNOSIS — G8929 Other chronic pain: Secondary | ICD-10-CM | POA: Diagnosis not present

## 2017-01-12 DIAGNOSIS — M19079 Primary osteoarthritis, unspecified ankle and foot: Secondary | ICD-10-CM | POA: Diagnosis not present

## 2017-01-12 DIAGNOSIS — Z0001 Encounter for general adult medical examination with abnormal findings: Secondary | ICD-10-CM | POA: Diagnosis not present

## 2017-01-12 DIAGNOSIS — M25561 Pain in right knee: Secondary | ICD-10-CM

## 2017-01-12 DIAGNOSIS — R739 Hyperglycemia, unspecified: Secondary | ICD-10-CM

## 2017-01-12 DIAGNOSIS — Z0000 Encounter for general adult medical examination without abnormal findings: Secondary | ICD-10-CM

## 2017-01-12 DIAGNOSIS — H9313 Tinnitus, bilateral: Secondary | ICD-10-CM | POA: Insufficient documentation

## 2017-01-12 DIAGNOSIS — R001 Bradycardia, unspecified: Secondary | ICD-10-CM

## 2017-01-12 MED ORDER — MELOXICAM 15 MG PO TABS: 15.0000 mg | ORAL_TABLET | Freq: Every day | ORAL | 1 refills | Status: DC | PRN

## 2017-01-12 NOTE — Assessment & Plan Note (Signed)
Podiatry ref offered

## 2017-01-12 NOTE — Assessment & Plan Note (Signed)
Meloxicam prn 

## 2017-01-12 NOTE — Assessment & Plan Note (Signed)
A1c is good 

## 2017-01-12 NOTE — Progress Notes (Signed)
Subjective:  Patient ID: Joe Williams, male    DOB: 12-02-1955  Age: 61 y.o. MRN: [id number]  CC: No chief complaint on file.   HPI RAINEN WEHRMAN presents for a well exam C/o ear noise L>R C/o L big toe painful bump C/o chronic R knee OA pain  Outpatient Medications Prior to Visit  Medication Sig Dispense Refill  . aspirin EC 81 MG tablet Take 81 mg by mouth daily.      . cholecalciferol (VITAMIN D) 1000 UNITS tablet Take 1,000 Units by mouth daily.      . meloxicam (MOBIC) 15 MG tablet Take 15 mg by mouth daily as needed for pain.    [name] omeprazole (PRILOSEC) 40 MG capsule Take 1 capsule (40 mg total) by mouth daily. Yearly physical w/labs are due must see MD for refills 30 capsule 0  . POTASSIUM GLUCONATE PO Take 1 tablet by mouth daily.    . pravastatin (PRAVACHOL) 40 MG tablet Take 1 tablet (40 mg total) by mouth daily. 90 tablet 3  . Saw Palmetto 160 MG TABS Take 1 tablet by mouth daily.    . vitamin C (ASCORBIC ACID) 500 MG tablet Take 500 mg by mouth daily.     Facility-Administered Medications Prior to Visit  Medication Dose Route Frequency Provider Last Rate Last Dose  . 0.9 %  sodium chloride infusion  500 mL Intravenous Continuous Gatha Mayer, MD        ROS Review of Systems  Constitutional: Negative for appetite change, fatigue and unexpected weight change.  HENT: Negative for congestion, nosebleeds, sneezing, sore throat and trouble swallowing.   Eyes: Negative for itching and visual disturbance.  Respiratory: Negative for cough.   Cardiovascular: Negative for chest pain, palpitations and leg swelling.  Gastrointestinal: Negative for abdominal distention, blood in stool, diarrhea and nausea.  Genitourinary: Negative for frequency and hematuria.  Musculoskeletal: Positive for arthralgias. Negative for back pain, gait problem, joint swelling and neck pain.  Skin: Negative for rash.  Neurological: Negative for dizziness, tremors, speech difficulty and weakness.    Psychiatric/Behavioral: Negative for agitation, dysphoric mood and sleep disturbance. The patient is not nervous/anxious.     Objective:  BP 112/72 (BP Location: Right Arm, Patient Position: Sitting, Cuff Size: Normal)   Pulse 62   Temp 98 F (36.7 C) (Oral)   Ht 6' (1.829 m)   Wt 248 lb 1.3 oz (112.5 kg)   SpO2 95%   BMI 33.65 kg/m   BP Readings from Last 3 Encounters:  01/12/17 112/72  12/13/16 126/84  01/12/16 120/70    Wt Readings from Last 3 Encounters:  01/12/17 248 lb 1.3 oz (112.5 kg)  12/13/16 253 lb (114.8 kg)  11/29/16 253 lb (114.8 kg)    Physical Exam  Constitutional: He is oriented to person, place, and time. He appears well-developed. No distress.  NAD  HENT:  Mouth/Throat: Oropharynx is clear and moist.  Eyes: Conjunctivae are normal. Pupils are equal, round, and reactive to light.  Neck: Normal range of motion. No JVD present. No thyromegaly present.  Cardiovascular: Normal rate, regular rhythm, normal heart sounds and intact distal pulses.  Exam reveals no gallop and no friction rub.   No murmur heard. Pulmonary/Chest: Effort normal and breath sounds normal. No respiratory distress. He has no wheezes. He has no rales. He exhibits no tenderness.  Abdominal: Soft. Bowel sounds are normal. He exhibits no distension and no mass. There is no tenderness. There is no rebound and  no guarding.  Musculoskeletal: Normal range of motion. He exhibits tenderness. He exhibits no edema.  Lymphadenopathy:    He has no cervical adenopathy.  Neurological: He is alert and oriented to person, place, and time. He has normal reflexes. No cranial nerve deficit. He exhibits normal muscle tone. He displays a negative Romberg sign. Coordination and gait normal.  Skin: Skin is warm and dry. No rash noted.  Psychiatric: He has a normal mood and affect. His behavior is normal. Judgment and thought content normal.  L big toe w/a growth on top Rectal - 3 wks ago  w/colonoscopy   Procedure: EKG Indication: well exam, h/o bradycardia Impression: S brady. No acute changes.  Lab Results  Component Value Date   WBC 4.0 01/05/2017   HGB 14.4 01/05/2017   HCT 42.0 01/05/2017   PLT 189.0 01/05/2017   GLUCOSE 114 (H) 01/05/2017   CHOL 161 01/05/2017   TRIG 67.0 01/05/2017   HDL 49.40 01/05/2017   LDLCALC 98 01/05/2017   ALT 25 01/05/2017   AST 21 01/05/2017   NA 141 01/05/2017   K 4.8 01/05/2017   CL 107 01/05/2017   CREATININE 1.00 01/05/2017   BUN 26 (H) 01/05/2017   CO2 28 01/05/2017   TSH 1.01 01/05/2017   PSA 0.95 01/05/2017   HGBA1C 5.9 01/05/2017    Dg Chest 2 View  Result Date: 10/25/2011 *RADIOLOGY REPORT* Clinical Data: Cough and shortness of breath.  Chest pain for 3 weeks.  Nonsmoker. CHEST - 2 VIEW Comparison: None. Findings: Midline trachea.  Normal heart size and mediastinal contours. No pleural effusion or pneumothorax.  Clear lungs. IMPRESSION: No acute cardiopulmonary disease. Original Report Authenticated By: Areta Haber, M.D.   Assessment & Plan:   There are no diagnoses linked to this encounter. I am having Mr. Coppock maintain his aspirin EC, cholecalciferol, vitamin C, POTASSIUM GLUCONATE PO, Saw Palmetto, meloxicam, pravastatin, and omeprazole. We will continue to administer sodium chloride.  No orders of the defined types were placed in this encounter.    Follow-up: No Follow-up on file.  Walker Kehr, MD

## 2017-01-12 NOTE — Progress Notes (Signed)
Pre visit review using our clinic review tool, if applicable. No additional management support is needed unless otherwise documented below in the visit note. 

## 2017-01-12 NOTE — Assessment & Plan Note (Signed)
We discussed age appropriate health related issues, including available/recomended screening tests and vaccinations. We discussed a need for adhering to healthy diet and exercise. Labs/EKG were reviewed/ordered. All questions were answered.   

## 2017-01-12 NOTE — Assessment & Plan Note (Signed)
ENT ref offered

## 2017-02-06 ENCOUNTER — Other Ambulatory Visit: Payer: Self-pay | Admitting: Internal Medicine

## 2017-02-22 ENCOUNTER — Other Ambulatory Visit: Payer: Self-pay | Admitting: Internal Medicine

## 2017-06-20 ENCOUNTER — Other Ambulatory Visit: Payer: Self-pay | Admitting: Internal Medicine

## 2017-07-13 ENCOUNTER — Telehealth: Payer: Self-pay | Admitting: Internal Medicine

## 2017-07-13 DIAGNOSIS — R7989 Other specified abnormal findings of blood chemistry: Secondary | ICD-10-CM

## 2017-07-13 DIAGNOSIS — R945 Abnormal results of liver function studies: Secondary | ICD-10-CM

## 2017-07-13 NOTE — Telephone Encounter (Signed)
Spouse called in stating that Dr. Camila Li wanted patient to come back in September for a liver function panel.  Would like orders to be placed so patient can have done before his next OV.  Would also like to know if Dr. Camila Li wants patient to fast for this lab.

## 2017-07-14 NOTE — Telephone Encounter (Signed)
Unable to reach pt/spouse, lab order entered and pt does not need to be fasting

## 2017-07-18 ENCOUNTER — Ambulatory Visit: Payer: 59 | Admitting: Internal Medicine

## 2017-07-24 ENCOUNTER — Other Ambulatory Visit (INDEPENDENT_AMBULATORY_CARE_PROVIDER_SITE_OTHER): Payer: 59

## 2017-07-24 DIAGNOSIS — R7989 Other specified abnormal findings of blood chemistry: Secondary | ICD-10-CM

## 2017-07-24 DIAGNOSIS — R945 Abnormal results of liver function studies: Secondary | ICD-10-CM

## 2017-07-24 LAB — HEPATIC FUNCTION PANEL
ALT: 22 U/L (ref 0–53)
AST: 19 U/L (ref 0–37)
Albumin: 4.4 g/dL (ref 3.5–5.2)
Alkaline Phosphatase: 31 U/L — ABNORMAL LOW (ref 39–117)
BILIRUBIN TOTAL: 0.5 mg/dL (ref 0.2–1.2)
Bilirubin, Direct: 0.1 mg/dL (ref 0.0–0.3)
Total Protein: 6.8 g/dL (ref 6.0–8.3)

## 2017-08-01 ENCOUNTER — Ambulatory Visit (INDEPENDENT_AMBULATORY_CARE_PROVIDER_SITE_OTHER): Payer: 59 | Admitting: Internal Medicine

## 2017-08-01 ENCOUNTER — Encounter: Payer: Self-pay | Admitting: Internal Medicine

## 2017-08-01 VITALS — BP 120/84 | HR 61 | Temp 98.5°F | Ht 72.0 in | Wt 250.0 lb

## 2017-08-01 DIAGNOSIS — K219 Gastro-esophageal reflux disease without esophagitis: Secondary | ICD-10-CM | POA: Diagnosis not present

## 2017-08-01 DIAGNOSIS — H9313 Tinnitus, bilateral: Secondary | ICD-10-CM | POA: Diagnosis not present

## 2017-08-01 DIAGNOSIS — Z23 Encounter for immunization: Secondary | ICD-10-CM | POA: Diagnosis not present

## 2017-08-01 DIAGNOSIS — E785 Hyperlipidemia, unspecified: Secondary | ICD-10-CM | POA: Diagnosis not present

## 2017-08-01 DIAGNOSIS — R739 Hyperglycemia, unspecified: Secondary | ICD-10-CM | POA: Diagnosis not present

## 2017-08-01 MED ORDER — MELOXICAM 15 MG PO TABS: 15.0000 mg | ORAL_TABLET | Freq: Every day | ORAL | 1 refills | Status: DC | PRN

## 2017-08-01 NOTE — Assessment & Plan Note (Addendum)
w/hearing loss Will ref to [facility]

## 2017-08-01 NOTE — Progress Notes (Signed)
Subjective:  Patient ID: Joe Williams, male    DOB: September 16, 1956  Age: 61 y.o. MRN: [id number]  CC: No chief complaint on file.   HPI Joe Williams presents for neck pain - dry needling, L shoulder pain - awaiting surgery F/u dyslipidemia, GERD C/o ringing in the ears, hearing loss  Outpatient Medications Prior to Visit  Medication Sig Dispense Refill  . aspirin EC 81 MG tablet Take 81 mg by mouth daily.      . cholecalciferol (VITAMIN D) 1000 UNITS tablet Take 1,000 Units by mouth daily.      . meloxicam (MOBIC) 15 MG tablet TAKE 1 TABLET (15 MG TOTAL) BY MOUTH DAILY AS NEEDED FOR PAIN. 90 tablet 1  . omeprazole (PRILOSEC) 40 MG capsule Take 1 capsule (40 mg total) by mouth daily. 90 capsule 3  . POTASSIUM GLUCONATE PO Take 1 tablet by mouth daily.    . Saw Palmetto 160 MG TABS Take 1 tablet by mouth daily.    . vitamin C (ASCORBIC ACID) 500 MG tablet Take 500 mg by mouth daily.    . pravastatin (PRAVACHOL) 40 MG tablet Take 1 tablet (40 mg total) by mouth daily. 90 tablet 3  . pravastatin (PRAVACHOL) 40 MG tablet TAKE 1 TABLET BY MOUTH EVERY DAY 90 tablet 3   Facility-Administered Medications Prior to Visit  Medication Dose Route Frequency Provider Last Rate Last Dose  . 0.9 %  sodium chloride infusion  500 mL Intravenous Continuous Gatha Mayer, MD        ROS Review of Systems  Constitutional: Negative for appetite change, fatigue and unexpected weight change.  HENT: Positive for hearing loss and tinnitus. Negative for congestion, nosebleeds, sneezing, sore throat and trouble swallowing.   Eyes: Negative for itching and visual disturbance.  Respiratory: Negative for cough.   Cardiovascular: Negative for chest pain, palpitations and leg swelling.  Gastrointestinal: Negative for abdominal distention, blood in stool, diarrhea and nausea.  Genitourinary: Negative for frequency and hematuria.  Musculoskeletal: Negative for back pain, gait problem, joint swelling and neck pain.   Skin: Negative for rash.  Neurological: Negative for dizziness, tremors, speech difficulty and weakness.  Psychiatric/Behavioral: Negative for agitation, dysphoric mood and sleep disturbance. The patient is not nervous/anxious.     Objective:  BP 120/84 (BP Location: Left Arm, Patient Position: Sitting, Cuff Size: Large)   Pulse 61   Temp 98.5 F (36.9 C) (Oral)   Ht 6' (1.829 m)   Wt 250 lb (113.4 kg)   SpO2 98%   BMI 33.91 kg/m   BP Readings from Last 3 Encounters:  08/01/17 120/84  01/12/17 112/72  12/13/16 126/84    Wt Readings from Last 3 Encounters:  08/01/17 250 lb (113.4 kg)  01/12/17 248 lb 1.3 oz (112.5 kg)  12/13/16 253 lb (114.8 kg)    Physical Exam  Constitutional: He is oriented to person, place, and time. He appears well-developed. No distress.  NAD  HENT:  Mouth/Throat: Oropharynx is clear and moist.  Eyes: Pupils are equal, round, and reactive to light. Conjunctivae are normal.  Neck: Normal range of motion. No JVD present. No thyromegaly present.  Cardiovascular: Normal rate, regular rhythm, normal heart sounds and intact distal pulses.  Exam reveals no gallop and no friction rub.   No murmur heard. Pulmonary/Chest: Effort normal and breath sounds normal. No respiratory distress. He has no wheezes. He has no rales. He exhibits no tenderness.  Abdominal: Soft. Bowel sounds are normal. He exhibits no  distension and no mass. There is no tenderness. There is no rebound and no guarding.  Musculoskeletal: Normal range of motion. He exhibits no edema or tenderness.  Lymphadenopathy:    He has no cervical adenopathy.  Neurological: He is alert and oriented to person, place, and time. He has normal reflexes. No cranial nerve deficit. He exhibits normal muscle tone. He displays a negative Romberg sign. Coordination and gait normal.  Skin: Skin is warm and dry. No rash noted.  Psychiatric: He has a normal mood and affect. His behavior is normal. Judgment and  thought content normal.    Lab Results  Component Value Date   WBC 4.0 01/05/2017   HGB 14.4 01/05/2017   HCT 42.0 01/05/2017   PLT 189.0 01/05/2017   GLUCOSE 114 (H) 01/05/2017   CHOL 161 01/05/2017   TRIG 67.0 01/05/2017   HDL 49.40 01/05/2017   LDLCALC 98 01/05/2017   ALT 22 07/24/2017   AST 19 07/24/2017   NA 141 01/05/2017   K 4.8 01/05/2017   CL 107 01/05/2017   CREATININE 1.00 01/05/2017   BUN 26 (H) 01/05/2017   CO2 28 01/05/2017   TSH 1.01 01/05/2017   PSA 0.95 01/05/2017   HGBA1C 5.9 01/05/2017    Dg Toe Great Left  Result Date: 01/12/2017 CLINICAL DATA:  Great toe pain with a bony growth dorsally for the past 68 months. EXAM: LEFT GREAT TOE COMPARISON:  None in PACs FINDINGS: The bones of the great toe are subjectively adequately mineralized. No bony mass is observed. There is no lytic or blastic lesion. The IP joint space is preserved. There is new moderate narrowing of the medial joint compartment. IMPRESSION: No abnormal bony mass is observed. If the patient's symptoms persist or worsen, MRI would be a useful next imaging step. Electronically Signed   By: David  Martinique M.D.   On: 01/12/2017 10:08    Assessment & Plan:   There are no diagnoses linked to this encounter. I have discontinued Mr. Lacosse's pravastatin and pravastatin. I am also having him maintain his aspirin EC, cholecalciferol, vitamin C, POTASSIUM GLUCONATE PO, Saw Palmetto, omeprazole, and meloxicam. We will continue to administer sodium chloride.  No orders of the defined types were placed in this encounter.    Follow-up: No Follow-up on file.  Walker Kehr, MD

## 2017-08-01 NOTE — Assessment & Plan Note (Signed)
Labs  Pravastatin

## 2017-08-01 NOTE — Assessment & Plan Note (Signed)
Labs

## 2017-08-01 NOTE — Assessment & Plan Note (Signed)
On Omeprazole 

## 2017-11-14 HISTORY — PX: KNEE ARTHROSCOPY: SHX127

## 2017-12-01 ENCOUNTER — Telehealth: Payer: Self-pay

## 2017-12-01 DIAGNOSIS — Z0000 Encounter for general adult medical examination without abnormal findings: Secondary | ICD-10-CM

## 2017-12-01 DIAGNOSIS — R739 Hyperglycemia, unspecified: Secondary | ICD-10-CM

## 2017-12-01 NOTE — Telephone Encounter (Signed)
Orders entered, pt notified  Copied from [place]. Topic: Appointment Scheduling - Scheduling Inquiry for Clinic >> Nov 28, 2017  3:12 PM Patrice Paradise wrote: Reason for CRM: Patient has an appt on 01/15/18 @ 8:15 am for his CPE, he would like for Dr. Alain Marion to put in orders for labs for the week before his CPE. Patient can be reached @ [phone]

## 2018-01-02 ENCOUNTER — Other Ambulatory Visit (INDEPENDENT_AMBULATORY_CARE_PROVIDER_SITE_OTHER): Payer: 59

## 2018-01-02 DIAGNOSIS — R739 Hyperglycemia, unspecified: Secondary | ICD-10-CM

## 2018-01-02 DIAGNOSIS — Z0000 Encounter for general adult medical examination without abnormal findings: Secondary | ICD-10-CM | POA: Diagnosis not present

## 2018-01-02 LAB — HEPATIC FUNCTION PANEL
ALBUMIN: 4.2 g/dL (ref 3.5–5.2)
ALK PHOS: 31 U/L — AB (ref 39–117)
ALT: 21 U/L (ref 0–53)
AST: 19 U/L (ref 0–37)
Bilirubin, Direct: 0.2 mg/dL (ref 0.0–0.3)
TOTAL PROTEIN: 6.5 g/dL (ref 6.0–8.3)
Total Bilirubin: 0.9 mg/dL (ref 0.2–1.2)

## 2018-01-02 LAB — URINALYSIS
Bilirubin Urine: NEGATIVE
Hgb urine dipstick: NEGATIVE
Ketones, ur: NEGATIVE
Leukocytes, UA: NEGATIVE
Nitrite: NEGATIVE
SPECIFIC GRAVITY, URINE: 1.015 (ref 1.000–1.030)
Total Protein, Urine: NEGATIVE
URINE GLUCOSE: NEGATIVE
UROBILINOGEN UA: 0.2 (ref 0.0–1.0)
pH: 6 (ref 5.0–8.0)

## 2018-01-02 LAB — CBC WITH DIFFERENTIAL/PLATELET
Basophils Absolute: 0.1 10*3/uL (ref 0.0–0.1)
Basophils Relative: 1.2 % (ref 0.0–3.0)
EOS ABS: 0.2 10*3/uL (ref 0.0–0.7)
Eosinophils Relative: 3.5 % (ref 0.0–5.0)
HCT: 42.9 % (ref 39.0–52.0)
Hemoglobin: 14.6 g/dL (ref 13.0–17.0)
Lymphocytes Relative: 37.7 % (ref 12.0–46.0)
Lymphs Abs: 1.7 10*3/uL (ref 0.7–4.0)
MCHC: 34 g/dL (ref 30.0–36.0)
MCV: 87.1 fl (ref 78.0–100.0)
MONO ABS: 0.3 10*3/uL (ref 0.1–1.0)
Monocytes Relative: 7.7 % (ref 3.0–12.0)
NEUTROS PCT: 49.9 % (ref 43.0–77.0)
Neutro Abs: 2.2 10*3/uL (ref 1.4–7.7)
Platelets: 183 10*3/uL (ref 150.0–400.0)
RBC: 4.93 Mil/uL (ref 4.22–5.81)
RDW: 13.2 % (ref 11.5–15.5)
WBC: 4.5 10*3/uL (ref 4.0–10.5)

## 2018-01-02 LAB — LIPID PANEL
CHOL/HDL RATIO: 4
Cholesterol: 154 mg/dL (ref 0–200)
HDL: 40.7 mg/dL (ref >39.00)
LDL Cholesterol: 90 mg/dL (ref 0–99)
NonHDL: 112.95
TRIGLYCERIDES: 116 mg/dL (ref 0.0–149.0)
VLDL: 23.2 mg/dL (ref 0.0–40.0)

## 2018-01-02 LAB — BASIC METABOLIC PANEL
BUN: 18 mg/dL (ref 6–23)
CALCIUM: 9.4 mg/dL (ref 8.4–10.5)
CO2: 30 mEq/L (ref 19–32)
CREATININE: 1.03 mg/dL (ref 0.40–1.50)
Chloride: 102 mEq/L (ref 96–112)
GFR: 77.66 mL/min (ref >60.00)
Glucose, Bld: 109 mg/dL — ABNORMAL HIGH (ref 70–99)
Potassium: 4.4 mEq/L (ref 3.5–5.1)
Sodium: 139 mEq/L (ref 135–145)

## 2018-01-02 LAB — PSA: PSA: 1.11 ng/mL (ref 0.10–4.00)

## 2018-01-02 LAB — TSH: TSH: 1.55 u[IU]/mL (ref 0.35–4.50)

## 2018-01-02 LAB — HEMOGLOBIN A1C: Hgb A1c MFr Bld: 5.9 % (ref 4.6–6.5)

## 2018-01-15 ENCOUNTER — Encounter: Payer: Self-pay | Admitting: Internal Medicine

## 2018-01-15 ENCOUNTER — Ambulatory Visit (INDEPENDENT_AMBULATORY_CARE_PROVIDER_SITE_OTHER): Payer: 59 | Admitting: Internal Medicine

## 2018-01-15 DIAGNOSIS — E739 Lactose intolerance, unspecified: Secondary | ICD-10-CM

## 2018-01-15 DIAGNOSIS — Z0000 Encounter for general adult medical examination without abnormal findings: Secondary | ICD-10-CM | POA: Diagnosis not present

## 2018-01-15 MED ORDER — OMEPRAZOLE 40 MG PO CPDR: 40.0000 mg | DELAYED_RELEASE_CAPSULE | Freq: Every day | ORAL | 3 refills | Status: DC

## 2018-01-15 MED ORDER — MELOXICAM 15 MG PO TABS: 15.0000 mg | ORAL_TABLET | Freq: Every day | ORAL | 1 refills | Status: DC | PRN

## 2018-01-15 MED ORDER — PRAVASTATIN SODIUM 40 MG PO TABS: 40.0000 mg | ORAL_TABLET | Freq: Every day | ORAL | 3 refills | Status: DC

## 2018-01-15 NOTE — Assessment & Plan Note (Signed)
We discussed age appropriate health related issues, including available/recomended screening tests and vaccinations. We discussed a need for adhering to healthy diet and exercise. Labs were ordered to be later reviewed . All questions were answered.   

## 2018-01-15 NOTE — Assessment & Plan Note (Addendum)
Pre-diabetis discussed Labs sooner if CBGs are up, dry mouth etc

## 2018-01-15 NOTE — Progress Notes (Signed)
Subjective:  Patient ID: Joe Williams, male    DOB: July 25, 1956  Age: 62 y.o. MRN: [id number]  CC: No chief complaint on file.   HPI Joe Williams presents for a well exam   Outpatient Medications Prior to Visit  Medication Sig Dispense Refill  . aspirin EC 81 MG tablet Take 81 mg by mouth daily.      . cholecalciferol (VITAMIN D) 1000 UNITS tablet Take 1,000 Units by mouth daily.      . meloxicam (MOBIC) 15 MG tablet Take 1 tablet (15 mg total) by mouth daily as needed for pain. 90 tablet 1  . omeprazole (PRILOSEC) 40 MG capsule Take 1 capsule (40 mg total) by mouth daily. 90 capsule 3  . POTASSIUM GLUCONATE PO Take 1 tablet by mouth daily.    . pravastatin (PRAVACHOL) 40 MG tablet Take 40 mg by mouth daily.  3  . Saw Palmetto 160 MG TABS Take 1 tablet by mouth daily.    . vitamin C (ASCORBIC ACID) 500 MG tablet Take 500 mg by mouth daily.     Facility-Administered Medications Prior to Visit  Medication Dose Route Frequency Provider Last Rate Last Dose  . 0.9 %  sodium chloride infusion  500 mL Intravenous Continuous Gatha Mayer, MD        ROS Review of Systems  Constitutional: Negative for appetite change, fatigue and unexpected weight change.  HENT: Negative for congestion, nosebleeds, sneezing, sore throat and trouble swallowing.   Eyes: Negative for itching and visual disturbance.  Respiratory: Negative for cough.   Cardiovascular: Negative for chest pain, palpitations and leg swelling.  Gastrointestinal: Negative for abdominal distention, blood in stool, diarrhea and nausea.  Genitourinary: Negative for frequency and hematuria.  Musculoskeletal: Negative for back pain, gait problem, joint swelling and neck pain.  Skin: Negative for rash.  Neurological: Negative for dizziness, tremors, speech difficulty and weakness.  Psychiatric/Behavioral: Negative for agitation, dysphoric mood and sleep disturbance. The patient is not nervous/anxious.     Objective:  BP 124/80  (BP Location: Left Arm, Patient Position: Sitting, Cuff Size: Large)   Pulse (!) 55   Temp 98.5 F (36.9 C) (Oral)   Ht 6' (1.829 m)   Wt 248 lb (112.5 kg)   SpO2 98%   BMI 33.63 kg/m   BP Readings from Last 3 Encounters:  01/15/18 124/80  08/01/17 120/84  01/12/17 112/72    Wt Readings from Last 3 Encounters:  01/15/18 248 lb (112.5 kg)  08/01/17 250 lb (113.4 kg)  01/12/17 248 lb 1.3 oz (112.5 kg)    Physical Exam  Constitutional: He is oriented to person, place, and time. He appears well-developed. No distress.  NAD  HENT:  Mouth/Throat: Oropharynx is clear and moist.  Eyes: Conjunctivae are normal. Pupils are equal, round, and reactive to light.  Neck: Normal range of motion. No JVD present. No thyromegaly present.  Cardiovascular: Normal rate, regular rhythm, normal heart sounds and intact distal pulses. Exam reveals no gallop and no friction rub.  No murmur heard. Pulmonary/Chest: Effort normal and breath sounds normal. No respiratory distress. He has no wheezes. He has no rales. He exhibits no tenderness.  Abdominal: Soft. Bowel sounds are normal. He exhibits no distension and no mass. There is no tenderness. There is no rebound and no guarding.  Genitourinary: Prostate normal. Rectal exam shows guaiac negative stool.  Musculoskeletal: Normal range of motion. He exhibits no edema or tenderness.  Lymphadenopathy:    He has no  cervical adenopathy.  Neurological: He is alert and oriented to person, place, and time. He has normal reflexes. No cranial nerve deficit. He exhibits normal muscle tone. He displays a negative Romberg sign. Coordination and gait normal.  Skin: Skin is warm and dry. No rash noted.  Psychiatric: He has a normal mood and affect. His behavior is normal. Judgment and thought content normal.  obese  Lab Results  Component Value Date   WBC 4.5 01/02/2018   HGB 14.6 01/02/2018   HCT 42.9 01/02/2018   PLT 183.0 01/02/2018   GLUCOSE 109 (H)  01/02/2018   CHOL 154 01/02/2018   TRIG 116.0 01/02/2018   HDL 40.70 01/02/2018   LDLCALC 90 01/02/2018   ALT 21 01/02/2018   AST 19 01/02/2018   NA 139 01/02/2018   K 4.4 01/02/2018   CL 102 01/02/2018   CREATININE 1.03 01/02/2018   BUN 18 01/02/2018   CO2 30 01/02/2018   TSH 1.55 01/02/2018   PSA 1.11 01/02/2018   HGBA1C 5.9 01/02/2018    Dg Toe Great Left  Result Date: 01/12/2017 CLINICAL DATA:  Great toe pain with a bony growth dorsally for the past 68 months. EXAM: LEFT GREAT TOE COMPARISON:  None in PACs FINDINGS: The bones of the great toe are subjectively adequately mineralized. No bony mass is observed. There is no lytic or blastic lesion. The IP joint space is preserved. There is new moderate narrowing of the medial joint compartment. IMPRESSION: No abnormal bony mass is observed. If the patient's symptoms persist or worsen, MRI would be a useful next imaging step. Electronically Signed   By: David  Martinique M.D.   On: 01/12/2017 10:08    Assessment & Plan:   There are no diagnoses linked to this encounter. I am having Joe Williams. Lieurance maintain his aspirin EC, cholecalciferol, vitamin C, POTASSIUM GLUCONATE PO, Saw Palmetto, omeprazole, meloxicam, and pravastatin. We will continue to administer sodium chloride.  No orders of the defined types were placed in this encounter.    Follow-up: No Follow-up on file.  Walker Kehr, MD

## 2018-11-30 ENCOUNTER — Telehealth: Payer: Self-pay

## 2018-11-30 DIAGNOSIS — Z0000 Encounter for general adult medical examination without abnormal findings: Secondary | ICD-10-CM

## 2018-11-30 DIAGNOSIS — R739 Hyperglycemia, unspecified: Secondary | ICD-10-CM

## 2018-11-30 NOTE — Telephone Encounter (Signed)
Copied from [place] [id number]. Topic: General - Other >> Nov 27, 2018  9:52 AM Carolyn Stare wrote:  Pt would like to have labs drawn before his CPE on 02/07/2019

## 2018-12-01 NOTE — Telephone Encounter (Addendum)
Ok CBC, BMET, LFT, UA, lipids, TSH, A1c, PSA Thx

## 2018-12-05 NOTE — Addendum Note (Signed)
Addended by: Karren Cobble on: 12/05/2018 11:22 AM   Modules accepted: Orders

## 2018-12-05 NOTE — Telephone Encounter (Signed)
Labs ordered.

## 2018-12-12 ENCOUNTER — Other Ambulatory Visit: Payer: Self-pay | Admitting: Internal Medicine

## 2019-01-02 ENCOUNTER — Other Ambulatory Visit: Payer: Self-pay | Admitting: Internal Medicine

## 2019-01-31 ENCOUNTER — Other Ambulatory Visit (INDEPENDENT_AMBULATORY_CARE_PROVIDER_SITE_OTHER): Payer: 59

## 2019-01-31 DIAGNOSIS — R739 Hyperglycemia, unspecified: Secondary | ICD-10-CM

## 2019-01-31 DIAGNOSIS — Z0000 Encounter for general adult medical examination without abnormal findings: Secondary | ICD-10-CM | POA: Diagnosis not present

## 2019-01-31 LAB — CBC WITH DIFFERENTIAL/PLATELET
Basophils Absolute: 0.1 10*3/uL (ref 0.0–0.1)
Basophils Relative: 1.2 % (ref 0.0–3.0)
EOS ABS: 0.1 10*3/uL (ref 0.0–0.7)
Eosinophils Relative: 2.5 % (ref 0.0–5.0)
HCT: 42.8 % (ref 39.0–52.0)
Hemoglobin: 14.7 g/dL (ref 13.0–17.0)
Lymphocytes Relative: 29.9 % (ref 12.0–46.0)
Lymphs Abs: 1.6 10*3/uL (ref 0.7–4.0)
MCHC: 34.3 g/dL (ref 30.0–36.0)
MCV: 86.8 fl (ref 78.0–100.0)
MONO ABS: 0.4 10*3/uL (ref 0.1–1.0)
Monocytes Relative: 7.7 % (ref 3.0–12.0)
Neutro Abs: 3.2 10*3/uL (ref 1.4–7.7)
Neutrophils Relative %: 58.7 % (ref 43.0–77.0)
Platelets: 194 10*3/uL (ref 150.0–400.0)
RBC: 4.94 Mil/uL (ref 4.22–5.81)
RDW: 13.2 % (ref 11.5–15.5)
WBC: 5.4 10*3/uL (ref 4.0–10.5)

## 2019-01-31 LAB — BASIC METABOLIC PANEL
BUN: 23 mg/dL (ref 6–23)
CO2: 28 mEq/L (ref 19–32)
Calcium: 9.4 mg/dL (ref 8.4–10.5)
Chloride: 101 mEq/L (ref 96–112)
Creatinine, Ser: 0.96 mg/dL (ref 0.40–1.50)
GFR: 78.97 mL/min (ref >60.00)
Glucose, Bld: 114 mg/dL — ABNORMAL HIGH (ref 70–99)
POTASSIUM: 4.8 meq/L (ref 3.5–5.1)
Sodium: 136 mEq/L (ref 135–145)

## 2019-01-31 LAB — URINALYSIS
Bilirubin Urine: NEGATIVE
Hgb urine dipstick: NEGATIVE
Ketones, ur: NEGATIVE
Leukocytes,Ua: NEGATIVE
Nitrite: NEGATIVE
PH: 5.5 (ref 5.0–8.0)
Specific Gravity, Urine: 1.025 (ref 1.000–1.030)
TOTAL PROTEIN, URINE-UPE24: NEGATIVE
UROBILINOGEN UA: 0.2 (ref 0.0–1.0)
Urine Glucose: NEGATIVE

## 2019-01-31 LAB — HEPATIC FUNCTION PANEL
ALT: 15 U/L (ref 0–53)
AST: 13 U/L (ref 0–37)
Albumin: 4.4 g/dL (ref 3.5–5.2)
Alkaline Phosphatase: 34 U/L — ABNORMAL LOW (ref 39–117)
Bilirubin, Direct: 0.1 mg/dL (ref 0.0–0.3)
Total Bilirubin: 0.6 mg/dL (ref 0.2–1.2)
Total Protein: 6.5 g/dL (ref 6.0–8.3)

## 2019-01-31 LAB — LIPID PANEL
CHOLESTEROL: 152 mg/dL (ref 0–200)
HDL: 35.4 mg/dL — ABNORMAL LOW (ref >39.00)
LDL CALC: 83 mg/dL (ref 0–99)
NonHDL: 116.13
TRIGLYCERIDES: 168 mg/dL — AB (ref 0.0–149.0)
Total CHOL/HDL Ratio: 4
VLDL: 33.6 mg/dL (ref 0.0–40.0)

## 2019-01-31 LAB — TSH: TSH: 1.75 u[IU]/mL (ref 0.35–4.50)

## 2019-01-31 LAB — PSA: PSA: 1.15 ng/mL (ref 0.10–4.00)

## 2019-01-31 LAB — HEMOGLOBIN A1C: Hgb A1c MFr Bld: 6 % (ref 4.6–6.5)

## 2019-02-06 ENCOUNTER — Telehealth: Payer: Self-pay | Admitting: *Deleted

## 2019-02-06 NOTE — Telephone Encounter (Signed)
workspace        Called/screened pt for COVID-19. He is asymptomatic and will keep ROV on 02/07/19 with PCP.

## 2019-02-07 ENCOUNTER — Telehealth: Payer: Self-pay | Admitting: Internal Medicine

## 2019-02-07 ENCOUNTER — Encounter: Payer: Self-pay | Admitting: Internal Medicine

## 2019-02-07 ENCOUNTER — Ambulatory Visit (INDEPENDENT_AMBULATORY_CARE_PROVIDER_SITE_OTHER): Payer: 59 | Admitting: Internal Medicine

## 2019-02-07 ENCOUNTER — Other Ambulatory Visit: Payer: Self-pay

## 2019-02-07 VITALS — BP 122/76 | HR 61 | Temp 98.5°F | Ht 72.0 in | Wt 251.0 lb

## 2019-02-07 DIAGNOSIS — G8929 Other chronic pain: Secondary | ICD-10-CM | POA: Diagnosis not present

## 2019-02-07 DIAGNOSIS — Z0000 Encounter for general adult medical examination without abnormal findings: Secondary | ICD-10-CM | POA: Diagnosis not present

## 2019-02-07 DIAGNOSIS — R739 Hyperglycemia, unspecified: Secondary | ICD-10-CM | POA: Diagnosis not present

## 2019-02-07 DIAGNOSIS — R3915 Urgency of urination: Secondary | ICD-10-CM | POA: Diagnosis not present

## 2019-02-07 DIAGNOSIS — M25561 Pain in right knee: Secondary | ICD-10-CM

## 2019-02-07 MED ORDER — TAMSULOSIN HCL 0.4 MG PO CAPS: 0.4000 mg | ORAL_CAPSULE | Freq: Every day | ORAL | 3 refills | Status: DC

## 2019-02-07 NOTE — Assessment & Plan Note (Signed)
A1c ok

## 2019-02-07 NOTE — Assessment & Plan Note (Signed)
Flomax offered Saw Palmeto

## 2019-02-07 NOTE — Assessment & Plan Note (Signed)
Arthroscopy B knees pending

## 2019-02-07 NOTE — Assessment & Plan Note (Signed)
We discussed age appropriate health related issues, including available/recomended screening tests and vaccinations. We discussed a need for adhering to healthy diet and exercise. Labs were ordered to be later reviewed . All questions were answered. Colon 1/18, due in 1/28  A cardiac CT scan for calcium scoring offered 3/20

## 2019-02-07 NOTE — Progress Notes (Signed)
Subjective:  Patient ID: Joe Williams, male    DOB: [birth year]  Age: 63 y.o. MRN: [id number]  CC: No chief complaint on file.   HPI Joe Williams presents for a well exam C/o occ urinary urgencies, dribbling in am  Outpatient Medications Prior to Visit  Medication Sig Dispense Refill  . aspirin EC 81 MG tablet Take 81 mg by mouth daily.      . cholecalciferol (VITAMIN D) 1000 UNITS tablet Take 1,000 Units by mouth daily.      . meloxicam (MOBIC) 15 MG tablet TAKE 1 TABLET BY MOUTH DAILY AS NEEDED FOR PAIN. 90 tablet 1  . omeprazole (PRILOSEC) 40 MG capsule Take 1 capsule (40 mg total) by mouth daily. 90 capsule 3  . POTASSIUM GLUCONATE PO Take 1 tablet by mouth daily.    . pravastatin (PRAVACHOL) 40 MG tablet TAKE 1 TABLET BY MOUTH EVERY DAY 90 tablet 3  . Saw Palmetto 160 MG TABS Take 1 tablet by mouth daily.    . vitamin C (ASCORBIC ACID) 500 MG tablet Take 500 mg by mouth daily.     Facility-Administered Medications Prior to Visit  Medication Dose Route Frequency Provider Last Rate Last Dose  . 0.9 %  sodium chloride infusion  500 mL Intravenous Continuous Gatha Mayer, MD        ROS: Review of Systems  Constitutional: Negative for appetite change, fatigue and unexpected weight change.  HENT: Negative for congestion, nosebleeds, sneezing, sore throat and trouble swallowing.   Eyes: Negative for itching and visual disturbance.  Respiratory: Negative for cough.   Cardiovascular: Negative for chest pain, palpitations and leg swelling.  Gastrointestinal: Negative for abdominal distention, blood in stool, diarrhea and nausea.  Genitourinary: Positive for difficulty urinating. Negative for frequency and hematuria.  Musculoskeletal: Negative for back pain, gait problem, joint swelling and neck pain.  Skin: Negative for rash.  Neurological: Negative for dizziness, tremors, speech difficulty and weakness.  Psychiatric/Behavioral: Negative for agitation, dysphoric mood and sleep  disturbance. The patient is not nervous/anxious.     Objective:  BP 122/76 (BP Location: Left Arm, Patient Position: Sitting, Cuff Size: Large)   Pulse 61   Temp 98.5 F (36.9 C) (Oral)   Ht 6' (1.829 m)   Wt 251 lb (113.9 kg)   SpO2 98%   BMI 34.04 kg/m   BP Readings from Last 3 Encounters:  02/07/19 122/76  01/15/18 124/80  08/01/17 120/84    Wt Readings from Last 3 Encounters:  02/07/19 251 lb (113.9 kg)  01/15/18 248 lb (112.5 kg)  08/01/17 250 lb (113.4 kg)    Physical Exam Constitutional:      General: He is not in acute distress.    Appearance: He is well-developed.     Comments: NAD  Eyes:     Conjunctiva/sclera: Conjunctivae normal.     Pupils: Pupils are equal, round, and reactive to light.  Neck:     Musculoskeletal: Normal range of motion.     Thyroid: No thyromegaly.     Vascular: No JVD.  Cardiovascular:     Rate and Rhythm: Normal rate and regular rhythm.     Heart sounds: Normal heart sounds. No murmur. No friction rub. No gallop.   Pulmonary:     Effort: Pulmonary effort is normal. No respiratory distress.     Breath sounds: Normal breath sounds. No wheezing or rales.  Chest:     Chest wall: No tenderness.  Abdominal:  General: Bowel sounds are normal. There is no distension.     Palpations: Abdomen is soft. There is no mass.     Tenderness: There is no abdominal tenderness. There is no guarding or rebound.  Genitourinary:    Rectum: Normal. Guaiac result negative.  Musculoskeletal: Normal range of motion.        General: No tenderness.  Lymphadenopathy:     Cervical: No cervical adenopathy.  Skin:    General: Skin is warm and dry.     Findings: No rash.  Neurological:     Mental Status: He is alert and oriented to person, place, and time.     Cranial Nerves: No cranial nerve deficit.     Motor: No abnormal muscle tone.     Coordination: Coordination normal.     Gait: Gait normal.     Deep Tendon Reflexes: Reflexes are normal and  symmetric.  Psychiatric:        Behavior: Behavior normal.        Thought Content: Thought content normal.        Judgment: Judgment normal.   prostate 1+  Lab Results  Component Value Date   WBC 5.4 01/31/2019   HGB 14.7 01/31/2019   HCT 42.8 01/31/2019   PLT 194.0 01/31/2019   GLUCOSE 114 (H) 01/31/2019   CHOL 152 01/31/2019   TRIG 168.0 (H) 01/31/2019   HDL 35.40 (L) 01/31/2019   LDLCALC 83 01/31/2019   ALT 15 01/31/2019   AST 13 01/31/2019   NA 136 01/31/2019   K 4.8 01/31/2019   CL 101 01/31/2019   CREATININE 0.96 01/31/2019   BUN 23 01/31/2019   CO2 28 01/31/2019   TSH 1.75 01/31/2019   PSA 1.15 01/31/2019   HGBA1C 6.0 01/31/2019    Dg Toe Great Left  Result Date: 01/12/2017 CLINICAL DATA:  Great toe pain with a bony growth dorsally for the past 68 months. EXAM: LEFT GREAT TOE COMPARISON:  None in PACs FINDINGS: The bones of the great toe are subjectively adequately mineralized. No bony mass is observed. There is no lytic or blastic lesion. The IP joint space is preserved. There is new moderate narrowing of the medial joint compartment. IMPRESSION: No abnormal bony mass is observed. If the patient's symptoms persist or worsen, MRI would be a useful next imaging step. Electronically Signed   By: David  Martinique M.D.   On: 01/12/2017 10:08    Assessment & Plan:   There are no diagnoses linked to this encounter.   No orders of the defined types were placed in this encounter.    Follow-up: No follow-ups on file.  Walker Kehr, MD

## 2019-02-07 NOTE — Telephone Encounter (Signed)
Copied from [place] [id number]. Topic: Quick Communication - See Telephone Encounter >> Feb 07, 2019  3:10 PM Loma [place] wrote: CRM for notification. See Telephone encounter for: 02/07/19. Pt saw Dr Mamie Nick this am and a Cardiac CT Calcium Test was offered. PT has read and reached out for info and has with certainty  decided that is what he wants to do. Please advise that this is a request and reach out to pt at [phone].

## 2019-02-07 NOTE — Patient Instructions (Signed)

## 2019-02-11 ENCOUNTER — Other Ambulatory Visit: Payer: Self-pay | Admitting: Internal Medicine

## 2019-02-11 NOTE — Telephone Encounter (Signed)
Pt.notified

## 2019-04-18 ENCOUNTER — Other Ambulatory Visit: Payer: Self-pay

## 2019-04-18 DIAGNOSIS — E785 Hyperlipidemia, unspecified: Secondary | ICD-10-CM

## 2019-04-18 NOTE — Telephone Encounter (Signed)
Patient checking on the status of orders mentioned below (chart doesn't reflect orders) informed patient due to the pandemic practice will reach out to patient. Best # [phone]

## 2019-05-09 ENCOUNTER — Other Ambulatory Visit: Payer: Self-pay | Admitting: Internal Medicine

## 2019-05-20 ENCOUNTER — Other Ambulatory Visit: Payer: Self-pay | Admitting: Internal Medicine

## 2019-06-13 ENCOUNTER — Other Ambulatory Visit: Payer: Self-pay

## 2019-06-13 ENCOUNTER — Ambulatory Visit (INDEPENDENT_AMBULATORY_CARE_PROVIDER_SITE_OTHER)
Admission: RE | Admit: 2019-06-13 | Discharge: 2019-06-13 | Disposition: A | Discharge status: Home / Self Care | Payer: Self-pay | Source: Ambulatory Visit | Attending: Internal Medicine | Admitting: Internal Medicine

## 2019-06-13 DIAGNOSIS — E785 Hyperlipidemia, unspecified: Secondary | ICD-10-CM

## 2019-06-14 ENCOUNTER — Other Ambulatory Visit: Payer: Self-pay | Admitting: Orthopedic Surgery

## 2019-06-14 DIAGNOSIS — M545 Low back pain, unspecified: Secondary | ICD-10-CM

## 2019-06-17 ENCOUNTER — Other Ambulatory Visit: Payer: Self-pay | Admitting: Internal Medicine

## 2019-06-17 DIAGNOSIS — R931 Abnormal findings on diagnostic imaging of heart and coronary circulation: Secondary | ICD-10-CM

## 2019-07-13 ENCOUNTER — Ambulatory Visit
Admission: RE | Admit: 2019-07-13 | Discharge: 2019-07-13 | Disposition: A | Discharge status: Home / Self Care | Payer: 59 | Source: Ambulatory Visit | Attending: Orthopedic Surgery | Admitting: Orthopedic Surgery

## 2019-07-13 ENCOUNTER — Other Ambulatory Visit: Payer: Self-pay

## 2019-07-13 DIAGNOSIS — M545 Low back pain, unspecified: Secondary | ICD-10-CM

## 2019-07-17 ENCOUNTER — Other Ambulatory Visit: Payer: Self-pay | Admitting: Orthopedic Surgery

## 2019-08-13 NOTE — Progress Notes (Signed)
_    Virtual Visit via Video Note   This visit type was conducted due to national recommendations for restrictions regarding the COVID-19 Pandemic (e.g. social distancing) in an effort to limit this patient's exposure and mitigate transmission in our community.  Due to his co-morbid illnesses, this patient is at least at moderate risk for complications without adequate follow up.  This format is felt to be most appropriate for this patient at this time.  All issues noted in this document were discussed and addressed.  A limited physical exam was performed with this format.  Please refer to the patient's chart for his consent to telehealth for [facility].  Evaluation Performed:  Follow-up visit  This visit type was conducted due to national recommendations for restrictions regarding the COVID-19 Pandemic (e.g. social distancing).  This format is felt to be most appropriate for this patient at this time.  All issues noted in this document were discussed and addressed.  No physical exam was performed (except for noted visual exam findings with Video Visits).  Please refer to the patient's chart (MyChart message for video visits and phone note for telephone visits) for the patient's consent to telehealth for [facility].  Date:  08/14/2019   ID:  KRYSTAL MURATALLA, DOB 10-07-1956, MRN [id number]  Patient Location:  Home  Provider location:   Joe [facility]  PCP:  Plotnikov, Evie Lacks, MD  Cardiologist:  NEW Electrophysiologist:  None   Chief Complaint:  Coronary artery calcifications  History of Present Illness:    Joe Williams is a 63 y.o. male who presents via audio/video conferencing for a telehealth visit today for evaluation of coronary artery calcifications by PCP Lew Dawes, MD.    This is a pleasant 63yo male with a hx of HLD, tobacco use, glucose intolerance and family hx of CAD who underwent coronary calcium score to assess future risk for cardiovascular dz.  His mom died of  an MI in her mid 34's.Coronary calcium score was 242.  He is here today for followup and is doing well.  He denies any chest pain or pressure, SOB, DOE, PND, orthopnea, LE edema, dizziness, palpitations or syncope. He is compliant with his meds and is tolerating meds with no SE.   The patient does not have symptoms concerning for COVID-19 infection (fever, chills, cough, or new shortness of breath).    Prior CV studies:   The following studies were reviewed today:  Chest CT coronary Calcium score  Past Medical History:  Diagnosis Date  . COLONIC POLYPS, HX OF    rectal-hyperplastic only 2007  . DYSFUNCTION, SINOATRIAL NODE    chronic sinus brady  . GERD   . GLUCOSE INTOLERANCE   . History of shingles   . HYPERLIPIDEMIA   . Morbid obesity ([name])   . PLANTAR FASCIITIS   . TOBACCO USE, QUIT    Past Surgical History:  Procedure Laterality Date  . COLONOSCOPY    . ROTATOR CUFF REPAIR  1995 (approx)   right -Dr. Mayer Camel     No outpatient medications have been marked as taking for the 08/14/19 encounter (Telemedicine) with Joe Margarita, MD.   Current Facility-Administered Medications for the 08/14/19 encounter (Telemedicine) with Joe Margarita, MD  Medication  . 0.9 %  sodium chloride infusion     Allergies:   Lovastatin and Tetracycline   Social History   Tobacco Use  . Smoking status: Former Research scientist (life sciences)  . Smokeless tobacco: Never Used  Substance Use Topics  .  Alcohol use: Yes    Alcohol/week: 3.0 standard drinks    Types: 3 Cans of beer per week    Comment: wknd  . Drug use: No     Family Hx: The patient's family history includes Dementia in an other family member; Gallbladder disease in his mother; Heart disease in his mother; Ulcers in his father and sister. There is no history of Colon cancer.  ROS:   Please see the history of present illness.     All other systems reviewed and are negative.   Labs/Other Tests and Data Reviewed:    Recent Labs: 01/31/2019:  ALT 15; BUN 23; Creatinine, Ser 0.96; Hemoglobin 14.7; Platelets 194.0; Potassium 4.8; Sodium 136; TSH 1.75   Recent Lipid Panel Lab Results  Component Value Date/Time   CHOL 152 01/31/2019 08:14 AM   TRIG 168.0 (H) 01/31/2019 08:14 AM   HDL 35.40 (L) 01/31/2019 08:14 AM   CHOLHDL 4 01/31/2019 08:14 AM   LDLCALC 83 01/31/2019 08:14 AM    Wt Readings from Last 3 Encounters:  08/14/19 245 lb (111.1 kg)  02/07/19 251 lb (113.9 kg)  01/15/18 248 lb (112.5 kg)     Objective:    Vital Signs:  BP 135/73   Pulse 64   Ht 6' (1.829 m)   Wt 245 lb (111.1 kg)   BMI 33.23 kg/m    CONSTITUTIONAL:  Well nourished, well developed male in no acute distress.  EYES: anicteric MOUTH: oral mucosa is pink RESPIRATORY: Normal respiratory effort, symmetric expansion CARDIOVASCULAR: No peripheral edema SKIN: No rash, lesions or ulcers MUSCULOSKELETAL: no digital cyanosis NEURO: Cranial Nerves II-XII grossly intact, moves all extremities PSYCH: Intact judgement and insight.  A&O x 3, Mood/affect appropriate   ASSESSMENT & PLAN:    1.  Coronary artery calcifications -noted on Chest CT at 272.   -no anginal sx at this time  -recommend aggressive risk factor modification -will get a Leixcan myoview to rule out silent ischemia -LDL goal  < 70 -HbA1C goal < 6.5%. -start ASA 81mg  daily  2.  HLD -LDL 83 in March -check LFP and ALT the day of stress test -continue pravastatin 40mg  daily  3.  Glucose intolerance -HbA1C at goal at 6%.   COVID-19 Education: The signs and symptoms of COVID-19 were discussed with the patient and how to seek care for testing (follow up with PCP or arrange E-visit).  The importance of social distancing was discussed today.  Patient Risk:   After full review of this patient's clinical status, I feel that they are at least moderate risk at this time.  Time:   Today, I have spent 20 minutes directly with the patient on telemedicine discussing medical problems  including coronary calcifications.  We also reviewed the symptoms of COVID 19 and the ways to protect against contracting the virus with telehealth technology.  I spent an additional 5 minutes reviewing patient's chart including chest CT and office visit notes.   Medication Adjustments/Labs and Tests Ordered: Current medicines are reviewed at length with the patient today.  Concerns regarding medicines are outlined above.  Medication changes, Labs and Tests ordered today are listed in the Patient Instructions below.  There are no Patient Instructions on file for this visit.   Signed, Fransico Him, MD  08/13/2019 9:46 PM    [facility] [address], [place], [place]  [address] Phone: [phone]; Fax: [phone]

## 2019-08-14 ENCOUNTER — Other Ambulatory Visit: Payer: Self-pay

## 2019-08-14 ENCOUNTER — Encounter: Payer: Self-pay | Admitting: *Deleted

## 2019-08-14 ENCOUNTER — Telehealth: Payer: Self-pay | Admitting: *Deleted

## 2019-08-14 ENCOUNTER — Telehealth (INDEPENDENT_AMBULATORY_CARE_PROVIDER_SITE_OTHER): Payer: 59 | Admitting: Cardiology

## 2019-08-14 VITALS — BP 135/73 | HR 64 | Ht 72.0 in | Wt 245.0 lb

## 2019-08-14 DIAGNOSIS — E785 Hyperlipidemia, unspecified: Secondary | ICD-10-CM | POA: Diagnosis not present

## 2019-08-14 DIAGNOSIS — E7800 Pure hypercholesterolemia, unspecified: Secondary | ICD-10-CM

## 2019-08-14 DIAGNOSIS — R9431 Abnormal electrocardiogram [ECG] [EKG]: Secondary | ICD-10-CM

## 2019-08-14 DIAGNOSIS — I2510 Atherosclerotic heart disease of native coronary artery without angina pectoris: Secondary | ICD-10-CM

## 2019-08-14 DIAGNOSIS — E7439 Other disorders of intestinal carbohydrate absorption: Secondary | ICD-10-CM

## 2019-08-14 DIAGNOSIS — I2584 Coronary atherosclerosis due to calcified coronary lesion: Secondary | ICD-10-CM

## 2019-08-14 NOTE — Addendum Note (Signed)
Addended by: Devra Dopp E on: 08/14/2019 09:23 AM   Modules accepted: Orders

## 2019-08-14 NOTE — Telephone Encounter (Signed)
YOUR CARDIOLOGY TEAM HAS ARRANGED FOR AN E-VISIT FOR YOUR APPOINTMENT - PLEASE REVIEW IMPORTANT INFORMATION BELOW SEVERAL DAYS PRIOR TO YOUR APPOINTMENT  Due to the recent COVID-19 pandemic, we are transitioning in-person office visits to tele-medicine visits in an effort to decrease unnecessary exposure to our patients, their families, and staff. These visits are billed to your insurance just like a normal visit is. We also encourage you to sign up for MyChart if you have not already done so. You will need a smartphone if possible. For patients that do not have this, we can still complete the visit using a regular telephone but do prefer a smartphone to enable video when possible. You may have a family member that lives with you that can help. If possible, we also ask that you have a blood pressure cuff and scale at home to measure your blood pressure, heart rate and weight prior to your scheduled appointment. Patients with clinical needs that need an in-person evaluation and testing will still be able to come to the office if absolutely necessary. If you have any questions, feel free to call our office.  YOUR PROVIDER WILL BE USING THE FOLLOWING PLATFORM TO COMPLETE YOUR VISIT: PHONE VISIT  ewed the consent and agree to move forward with your scheduled tele-health visit.   THE DAY OF YOUR APPOINTMENT  Approximately 15 minutes prior to your scheduled appointment, you will receive a telephone call from one of [facility] team - your caller ID may say "Unknown caller."  Our staff will confirm medications, vital signs for the day and any symptoms you may be experiencing. Please have this information available prior to the time of visit start. It may also be helpful for you to have a pad of paper and pen handy for any instructions given during your visit. They will also walk you through joining the smartphone meeting if this is a video visit.  CONSENT FOR TELE-HEALTH VISIT - PLEASE REVIEW  I hereby  voluntarily request, consent and authorize [facility] and its employed or contracted physicians, physician assistants, nurse practitioners or other licensed health care professionals (the Practitioner), to provide me with telemedicine health care services (the "Services") as deemed necessary by the treating Practitioner. I acknowledge and consent to receive the Services by the Practitioner via telemedicine. I understand that the telemedicine visit will involve communicating with the Practitioner through live audiovisual communication technology and the disclosure of certain medical information by electronic transmission. I acknowledge that I have been given the opportunity to request an in-person assessment or other available alternative prior to the telemedicine visit and am voluntarily participating in the telemedicine visit.  I understand that I have the right to withhold or withdraw my consent to the use of telemedicine in the course of my care at any time, without affecting my right to future care or treatment, and that the Practitioner or I may terminate the telemedicine visit at any time. I understand that I have the right to inspect all information obtained and/or recorded in the course of the telemedicine visit and may receive copies of available information for a reasonable fee.  I understand that some of the potential risks of receiving the Services via telemedicine include:  [name] Delay or interruption in medical evaluation due to technological equipment failure or disruption; . Information transmitted may not be sufficient (e.g. poor resolution of images) to allow for appropriate medical decision making by the Practitioner; and/or  . In rare instances, security protocols could fail, causing a  breach of personal health information.  Furthermore, I acknowledge that it is my responsibility to provide information about my medical history, conditions and care that is complete and accurate to the best  of my ability. I acknowledge that Practitioner's advice, recommendations, and/or decision may be based on factors not within their control, such as incomplete or inaccurate data provided by me or distortions of diagnostic images or specimens that may result from electronic transmissions. I understand that the practice of medicine is not an exact science and that Practitioner makes no warranties or guarantees regarding treatment outcomes. I acknowledge that I will receive a copy of this consent concurrently upon execution via email to the email address I last provided but may also request a printed copy by calling the office of [place].    I understand that my insurance will be billed for this visit.   I have read or had this consent read to me. . I understand the contents of this consent, which adequately explains the benefits and risks of the Services being provided via telemedicine.  . I have been provided ample opportunity to ask questions regarding this consent and the Services and have had my questions answered to my satisfaction. . I give my informed consent for the services to be provided through the use of telemedicine in my medical care  By participating in this telemedicine visit I agree to the above.

## 2019-08-14 NOTE — Patient Instructions (Signed)
Your physician recommends that you continue on your current medications as directed. Please refer to the Current Medication list given to you today.  Your physician recommends that you return for lab work in: [place] LIPID AND ALT  Your physician has requested that you have a lexiscan myoview. For further information please visit [email or web address]. Please follow instruction sheet, as given.   Your physician wants you to follow-up in: [place] will receive a reminder letter in the mail two months in advance. If you don't receive a letter, please call our office to schedule the follow-up appointment.

## 2019-08-19 ENCOUNTER — Telehealth (HOSPITAL_COMMUNITY): Payer: Self-pay

## 2019-08-19 NOTE — Telephone Encounter (Signed)
Spoke with the patient's wife, she took his instructions and said that he would be here for his test. Asked to call back with any questions. S.Nhung Danko EMTP

## 2019-08-20 ENCOUNTER — Ambulatory Visit (HOSPITAL_COMMUNITY): Payer: No Typology Code available for payment source | Attending: Cardiovascular Disease

## 2019-08-20 ENCOUNTER — Other Ambulatory Visit: Payer: Self-pay

## 2019-08-20 ENCOUNTER — Other Ambulatory Visit: Payer: 59

## 2019-08-20 VITALS — Ht 72.0 in | Wt 245.0 lb

## 2019-08-20 DIAGNOSIS — E785 Hyperlipidemia, unspecified: Secondary | ICD-10-CM

## 2019-08-20 DIAGNOSIS — R9431 Abnormal electrocardiogram [ECG] [EKG]: Secondary | ICD-10-CM | POA: Insufficient documentation

## 2019-08-20 DIAGNOSIS — R0789 Other chest pain: Secondary | ICD-10-CM | POA: Diagnosis present

## 2019-08-20 LAB — MYOCARDIAL PERFUSION IMAGING
LV dias vol: 114 mL (ref 62–150)
LV sys vol: 42 mL
Peak HR: 75 {beats}/min
Rest HR: 55 {beats}/min
SDS: 0
SRS: 0
SSS: 0
TID: 1.02

## 2019-08-20 LAB — LIPID PANEL
Chol/HDL Ratio: 2.9 ratio (ref 0.0–5.0)
Cholesterol, Total: 153 mg/dL (ref 100–199)
HDL: 52 mg/dL (ref >39)
LDL Chol Calc (NIH): 90 mg/dL (ref 0–99)
Triglycerides: 50 mg/dL (ref 0–149)
VLDL Cholesterol Cal: 11 mg/dL (ref 5–40)

## 2019-08-20 LAB — ALT: ALT: 25 IU/L (ref 0–44)

## 2019-08-20 MED ORDER — REGADENOSON 0.4 MG/5ML IV SOLN
0.4000 mg | Freq: Once | INTRAVENOUS | Status: AC
  Administered 2019-08-20: 0.4 mg via INTRAVENOUS

## 2019-08-20 MED ORDER — TECHNETIUM TC 99M TETROFOSMIN IV KIT
10.9000 | PACK | Freq: Once | INTRAVENOUS | Status: AC | PRN
  Administered 2019-08-20: 10.9 via INTRAVENOUS
  Filled 2019-08-20: qty 11

## 2019-08-20 MED ORDER — TECHNETIUM TC 99M TETROFOSMIN IV KIT
30.7000 | PACK | Freq: Once | INTRAVENOUS | Status: AC | PRN
  Administered 2019-08-20: 30.7 via INTRAVENOUS
  Filled 2019-08-20: qty 31

## 2019-08-21 ENCOUNTER — Telehealth: Payer: Self-pay

## 2019-08-21 DIAGNOSIS — E785 Hyperlipidemia, unspecified: Secondary | ICD-10-CM

## 2019-08-21 MED ORDER — PRAVASTATIN SODIUM 80 MG PO TABS: 80.0000 mg | ORAL_TABLET | Freq: Every evening | ORAL | 3 refills | Status: DC

## 2019-08-21 NOTE — Telephone Encounter (Signed)
Notes recorded by Frederik Schmidt, RN on 08/21/2019 at 8:35 AM EDT  The patient has been notified of the stress test result and verbalized understanding. All questions (if any) were answered.  Frederik Schmidt, RN 08/21/2019 8:35 AM

## 2019-08-21 NOTE — Telephone Encounter (Signed)
-----   Message from Sueanne Margarita, MD sent at 08/20/2019  5:08 PM EDT ----- Please let patient know that stress test was fine

## 2019-08-21 NOTE — Telephone Encounter (Signed)
Notes recorded by Frederik Schmidt, RN on 08/21/2019 at 8:36 AM EDT  The patient has been notified of the result and verbalized understanding. All questions (if any) were answered.  Frederik Schmidt, RN 08/21/2019 8:36 AM

## 2019-08-21 NOTE — Telephone Encounter (Signed)
-----   Message from Sueanne Margarita, MD sent at 08/20/2019  5:05 PM EDT ----- LDL not at goal - increase Pravastatin 80mg  daily and repeat FLP and ALT in 6 weeks

## 2019-09-20 ENCOUNTER — Other Ambulatory Visit: Payer: 59

## 2019-10-02 ENCOUNTER — Other Ambulatory Visit: Payer: 59 | Admitting: *Deleted

## 2019-10-02 ENCOUNTER — Other Ambulatory Visit: Payer: Self-pay

## 2019-10-02 DIAGNOSIS — E785 Hyperlipidemia, unspecified: Secondary | ICD-10-CM

## 2019-10-02 LAB — LIPID PANEL
Chol/HDL Ratio: 2.7 ratio (ref 0.0–5.0)
Cholesterol, Total: 137 mg/dL (ref 100–199)
HDL: 50 mg/dL (ref >39)
LDL Chol Calc (NIH): 73 mg/dL (ref 0–99)
Triglycerides: 69 mg/dL (ref 0–149)
VLDL Cholesterol Cal: 14 mg/dL (ref 5–40)

## 2019-10-02 LAB — ALT: ALT: 24 IU/L (ref 0–44)

## 2019-10-03 ENCOUNTER — Telehealth: Payer: Self-pay | Admitting: *Deleted

## 2019-10-03 DIAGNOSIS — E785 Hyperlipidemia, unspecified: Secondary | ICD-10-CM

## 2019-10-03 NOTE — Telephone Encounter (Signed)
-----   Message from Sueanne Margarita, MD sent at 10/02/2019  6:35 PM EST ----- LDL improved and close to goal.  Please encourage patient to decrease fats, carbs and sugars in diet and exercise more.  Repeat FLP in 4 months

## 2019-10-03 NOTE — Telephone Encounter (Signed)
DPR ok to s/w pt's wife who has been made aware of pt's lab results by phone with verbal understanding. Pt's wife made 4 month lab appt 01/31/20 for repeat FLP. Patient notified of result.  Please refer to phone note from today for complete details.   Julaine Hua, [facility] 10/03/2019 8:49 AM

## 2019-10-13 ENCOUNTER — Other Ambulatory Visit: Payer: Self-pay | Admitting: Internal Medicine

## 2019-12-19 ENCOUNTER — Other Ambulatory Visit: Payer: Self-pay | Admitting: Internal Medicine

## 2020-01-27 ENCOUNTER — Telehealth: Payer: Self-pay | Admitting: Internal Medicine

## 2020-01-27 DIAGNOSIS — R739 Hyperglycemia, unspecified: Secondary | ICD-10-CM

## 2020-01-27 DIAGNOSIS — Z0000 Encounter for general adult medical examination without abnormal findings: Secondary | ICD-10-CM

## 2020-01-27 NOTE — Telephone Encounter (Signed)
New message:   Pt would like to have labs a week before his appt on 02/10/20. Please advise patient when labs are in the system.

## 2020-01-28 NOTE — Telephone Encounter (Signed)
LM notifying labs ordered and to call back to schedule lab appt

## 2020-01-31 ENCOUNTER — Other Ambulatory Visit: Payer: 59 | Admitting: *Deleted

## 2020-01-31 ENCOUNTER — Other Ambulatory Visit: Payer: Self-pay

## 2020-01-31 DIAGNOSIS — E785 Hyperlipidemia, unspecified: Secondary | ICD-10-CM

## 2020-01-31 LAB — LIPID PANEL
Chol/HDL Ratio: 2.7 ratio (ref 0.0–5.0)
Cholesterol, Total: 144 mg/dL (ref 100–199)
HDL: 53 mg/dL (ref >39)
LDL Chol Calc (NIH): 73 mg/dL (ref 0–99)
Triglycerides: 96 mg/dL (ref 0–149)
VLDL Cholesterol Cal: 18 mg/dL (ref 5–40)

## 2020-02-03 ENCOUNTER — Other Ambulatory Visit: Payer: Self-pay

## 2020-02-03 ENCOUNTER — Other Ambulatory Visit (INDEPENDENT_AMBULATORY_CARE_PROVIDER_SITE_OTHER): Payer: 59

## 2020-02-03 DIAGNOSIS — Z0000 Encounter for general adult medical examination without abnormal findings: Secondary | ICD-10-CM

## 2020-02-03 DIAGNOSIS — R739 Hyperglycemia, unspecified: Secondary | ICD-10-CM | POA: Diagnosis not present

## 2020-02-03 LAB — CBC WITH DIFFERENTIAL/PLATELET
Basophils Absolute: 0.1 10*3/uL (ref 0.0–0.1)
Basophils Relative: 1.1 % (ref 0.0–3.0)
Eosinophils Absolute: 0.2 10*3/uL (ref 0.0–0.7)
Eosinophils Relative: 3.3 % (ref 0.0–5.0)
HCT: 41.7 % (ref 39.0–52.0)
Hemoglobin: 14 g/dL (ref 13.0–17.0)
Lymphocytes Relative: 34.5 % (ref 12.0–46.0)
Lymphs Abs: 1.7 10*3/uL (ref 0.7–4.0)
MCHC: 33.6 g/dL (ref 30.0–36.0)
MCV: 88.4 fl (ref 78.0–100.0)
Monocytes Absolute: 0.3 10*3/uL (ref 0.1–1.0)
Monocytes Relative: 6.4 % (ref 3.0–12.0)
Neutro Abs: 2.7 10*3/uL (ref 1.4–7.7)
Neutrophils Relative %: 54.7 % (ref 43.0–77.0)
Platelets: 182 10*3/uL (ref 150.0–400.0)
RBC: 4.72 Mil/uL (ref 4.22–5.81)
RDW: 13.8 % (ref 11.5–15.5)
WBC: 4.9 10*3/uL (ref 4.0–10.5)

## 2020-02-03 LAB — BASIC METABOLIC PANEL
BUN: 22 mg/dL (ref 6–23)
CO2: 28 mEq/L (ref 19–32)
Calcium: 9 mg/dL (ref 8.4–10.5)
Chloride: 104 mEq/L (ref 96–112)
Creatinine, Ser: 0.87 mg/dL (ref 0.40–1.50)
GFR: 88.19 mL/min (ref >60.00)
Glucose, Bld: 115 mg/dL — ABNORMAL HIGH (ref 70–99)
Potassium: 4.3 mEq/L (ref 3.5–5.1)
Sodium: 138 mEq/L (ref 135–145)

## 2020-02-03 LAB — URINALYSIS
Bilirubin Urine: NEGATIVE
Hgb urine dipstick: NEGATIVE
Ketones, ur: NEGATIVE
Leukocytes,Ua: NEGATIVE
Nitrite: NEGATIVE
Specific Gravity, Urine: 1.025 (ref 1.000–1.030)
Total Protein, Urine: NEGATIVE
Urine Glucose: NEGATIVE
Urobilinogen, UA: 0.2 (ref 0.0–1.0)
pH: 5.5 (ref 5.0–8.0)

## 2020-02-03 LAB — LIPID PANEL
Cholesterol: 143 mg/dL (ref 0–200)
HDL: 44.4 mg/dL (ref >39.00)
LDL Cholesterol: 78 mg/dL (ref 0–99)
NonHDL: 98.46
Total CHOL/HDL Ratio: 3
Triglycerides: 101 mg/dL (ref 0.0–149.0)
VLDL: 20.2 mg/dL (ref 0.0–40.0)

## 2020-02-03 LAB — HEPATIC FUNCTION PANEL
ALT: 24 U/L (ref 0–53)
AST: 18 U/L (ref 0–37)
Albumin: 4.2 g/dL (ref 3.5–5.2)
Alkaline Phosphatase: 34 U/L — ABNORMAL LOW (ref 39–117)
Bilirubin, Direct: 0.1 mg/dL (ref 0.0–0.3)
Total Bilirubin: 0.5 mg/dL (ref 0.2–1.2)
Total Protein: 6.3 g/dL (ref 6.0–8.3)

## 2020-02-03 LAB — HEMOGLOBIN A1C: Hgb A1c MFr Bld: 5.7 % (ref 4.6–6.5)

## 2020-02-03 LAB — PSA: PSA: 0.97 ng/mL (ref 0.10–4.00)

## 2020-02-03 LAB — TSH: TSH: 1.48 u[IU]/mL (ref 0.35–4.50)

## 2020-02-10 ENCOUNTER — Ambulatory Visit (INDEPENDENT_AMBULATORY_CARE_PROVIDER_SITE_OTHER): Payer: 59 | Admitting: Internal Medicine

## 2020-02-10 ENCOUNTER — Encounter: Payer: Self-pay | Admitting: Internal Medicine

## 2020-02-10 ENCOUNTER — Other Ambulatory Visit: Payer: Self-pay

## 2020-02-10 VITALS — BP 126/74 | HR 56 | Temp 97.9°F | Ht 72.0 in | Wt 249.0 lb

## 2020-02-10 DIAGNOSIS — E785 Hyperlipidemia, unspecified: Secondary | ICD-10-CM

## 2020-02-10 DIAGNOSIS — E739 Lactose intolerance, unspecified: Secondary | ICD-10-CM

## 2020-02-10 DIAGNOSIS — Z0000 Encounter for general adult medical examination without abnormal findings: Secondary | ICD-10-CM

## 2020-02-10 DIAGNOSIS — I2510 Atherosclerotic heart disease of native coronary artery without angina pectoris: Secondary | ICD-10-CM | POA: Insufficient documentation

## 2020-02-10 MED ORDER — MELOXICAM 15 MG PO TABS: ORAL_TABLET | ORAL | 1 refills | Status: DC

## 2020-02-10 MED ORDER — PRAVASTATIN SODIUM 80 MG PO TABS: 80.0000 mg | ORAL_TABLET | Freq: Every evening | ORAL | 3 refills | Status: DC

## 2020-02-10 MED ORDER — TAMSULOSIN HCL 0.4 MG PO CAPS: 0.4000 mg | ORAL_CAPSULE | Freq: Every day | ORAL | 3 refills | Status: DC

## 2020-02-10 NOTE — Assessment & Plan Note (Signed)
Pravastatin  

## 2020-02-10 NOTE — Assessment & Plan Note (Signed)
F/u Dr Turner Pravastatin, ASA 

## 2020-02-10 NOTE — Assessment & Plan Note (Signed)
Glu 115

## 2020-02-10 NOTE — Patient Instructions (Signed)
FEMA mass vaccine site in [place]:  Call the COVID-19 Vaccine Help Center at 1-888-675-4567 to schedule your shot at [place].   [place] federal vaccine clinic is preparing to open on Wednesday 01/22/20. The FEMA site at [place] has the capacity to vaccinate 3,000 people a day for eight weeks. That's nearly 170,000 doses just from this one clinic.   With such a big operation, here are answers to some questions you may have about the drive-thru and indoor site:  How do I make an appointment?   Head to [facility] to schedule your appointment indoors or in the drive-thru, or call the COVID-19 Vaccine Help Center at 1-888-675-4567.  What if I need to change or cancel my appointment, or have more questions?   Call the COVID-19 Vaccine Help Center at 1-888-675-4567.  What vaccines will be available at the clinic?   The vaccine clinic will begin giving both Pfizer and Moderna two-dose COVID-19 vaccines. The single-dose [employer] vaccines will be given during the last two weeks of the clinic.   What part of the [place] do I enter for my appointment?  Enter from [address] and turn onto [address]. A clinic staff member will confirm your appointment for the day. You'll then either be directed to registration or to a waiting area until your appointment time.  Can I be seen sooner if I'm early?  Those who are early will park in a designated waiting area until their vaccine appointment time approaches.  How does registration work?  There are five open lanes for registration. Someone will get the necessary information needed to confirm appointments and other details to keep a record of who's getting the vaccine every day. Temperatures will be checked to make sure you're in good shape to get the vaccine.  How long will it take to get my shot?  You'll park your car in a long tent with about 10  other vehicles. All 10 people in that group will get a shot inside their vehicles. Getting the actual shot only takes a few minutes. The entire process takes about 30 minutes.  How does the observation period work?  All patients will wait in the same tent they got their vaccine at for 15 minutes.       

## 2020-02-10 NOTE — Assessment & Plan Note (Signed)
  We discussed age appropriate health related issues, including available/recomended screening tests and vaccinations. Labs were ordered to be later reviewed . All questions were answered. We discussed one or more of the following - seat belt use, use of sunscreen/sun exposure exercise, safe sex, fall risk reduction, second hand smoke exposure, firearm use and storage, seat belt use, a need for adhering to healthy diet and exercise. Labs were  discussed if they are available. All questions were answered.    

## 2020-02-10 NOTE — Progress Notes (Signed)
Subjective:  Patient ID: Joe Williams, male    DOB: [birth year]  Age: 64 y.o. MRN: [id number]  CC: No chief complaint on file.   HPI Joe Williams presents for a well exam  Outpatient Medications Prior to Visit  Medication Sig Dispense Refill  . aspirin EC 81 MG tablet Take 81 mg by mouth daily.      . cholecalciferol (VITAMIN D) 1000 UNITS tablet Take 1,000 Units by mouth daily.      . melatonin 5 MG TABS Take 5 mg by mouth.    . meloxicam (MOBIC) 15 MG tablet TAKE 1 TABLET BY MOUTH EVERY DAY AS NEEDED FOR PAIN 90 tablet 1  . omeprazole (PRILOSEC) 40 MG capsule TAKE 1 CAPSULE BY MOUTH EVERY DAY 90 capsule 3  . POTASSIUM GLUCONATE PO Take 1 tablet by mouth daily.    . pravastatin (PRAVACHOL) 80 MG tablet Take 1 tablet (80 mg total) by mouth every evening. 90 tablet 3  . Saw Palmetto 160 MG TABS Take 1 tablet by mouth daily.    . tamsulosin (FLOMAX) 0.4 MG CAPS capsule TAKE 1 CAPSULE BY MOUTH EVERY DAY 90 capsule 3  . vitamin C (ASCORBIC ACID) 500 MG tablet Take 500 mg by mouth daily.     Facility-Administered Medications Prior to Visit  Medication Dose Route Frequency Provider Last Rate Last Admin  . 0.9 %  sodium chloride infusion  500 mL Intravenous Continuous Gatha Mayer, MD        ROS: Review of Systems  Constitutional: Negative for appetite change, fatigue and unexpected weight change.  HENT: Negative for congestion, nosebleeds, sneezing, sore throat and trouble swallowing.   Eyes: Negative for itching and visual disturbance.  Respiratory: Negative for cough.   Cardiovascular: Negative for chest pain, palpitations and leg swelling.  Gastrointestinal: Negative for abdominal distention, blood in stool, diarrhea and nausea.  Genitourinary: Negative for frequency and hematuria.  Musculoskeletal: Negative for back pain, gait problem, joint swelling and neck pain.  Skin: Negative for rash.  Neurological: Negative for dizziness, tremors, speech difficulty and weakness.    Psychiatric/Behavioral: Negative for agitation, dysphoric mood, sleep disturbance and suicidal ideas. The patient is not nervous/anxious.     Objective:  BP 126/74 (BP Location: Left Arm, Patient Position: Sitting, Cuff Size: Large)   Pulse (!) 56   Temp 97.9 F (36.6 C) (Oral)   Ht 6' (1.829 m)   Wt 249 lb (112.9 kg)   SpO2 98%   BMI 33.77 kg/m   BP Readings from Last 3 Encounters:  02/10/20 126/74  08/14/19 135/73  02/07/19 122/76    Wt Readings from Last 3 Encounters:  02/10/20 249 lb (112.9 kg)  08/20/19 245 lb (111.1 kg)  08/14/19 245 lb (111.1 kg)    Physical Exam Constitutional:      General: He is not in acute distress.    Appearance: He is well-developed.     Comments: NAD  Eyes:     Conjunctiva/sclera: Conjunctivae normal.     Pupils: Pupils are equal, round, and reactive to light.  Neck:     Thyroid: No thyromegaly.     Vascular: No JVD.  Cardiovascular:     Rate and Rhythm: Normal rate and regular rhythm.     Heart sounds: Normal heart sounds. No murmur. No friction rub. No gallop.   Pulmonary:     Effort: Pulmonary effort is normal. No respiratory distress.     Breath sounds: Normal breath sounds. No wheezing or  rales.  Chest:     Chest wall: No tenderness.  Abdominal:     General: Bowel sounds are normal. There is no distension.     Palpations: Abdomen is soft. There is no mass.     Tenderness: There is no abdominal tenderness. There is no guarding or rebound.  Musculoskeletal:        General: No tenderness. Normal range of motion.     Cervical back: Normal range of motion.  Lymphadenopathy:     Cervical: No cervical adenopathy.  Skin:    General: Skin is warm and dry.     Findings: No rash.  Neurological:     Mental Status: He is alert and oriented to person, place, and time.     Cranial Nerves: No cranial nerve deficit.     Motor: No abnormal muscle tone.     Coordination: Coordination normal.     Gait: Gait normal.     Deep Tendon  Reflexes: Reflexes are normal and symmetric.  Psychiatric:        Behavior: Behavior normal.        Thought Content: Thought content normal.        Judgment: Judgment normal.     Lab Results  Component Value Date   WBC 4.9 02/03/2020   HGB 14.0 02/03/2020   HCT 41.7 02/03/2020   PLT 182.0 02/03/2020   GLUCOSE 115 (H) 02/03/2020   CHOL 143 02/03/2020   TRIG 101.0 02/03/2020   HDL 44.40 02/03/2020   LDLCALC 78 02/03/2020   ALT 24 02/03/2020   AST 18 02/03/2020   NA 138 02/03/2020   K 4.3 02/03/2020   CL 104 02/03/2020   CREATININE 0.87 02/03/2020   BUN 22 02/03/2020   CO2 28 02/03/2020   TSH 1.48 02/03/2020   PSA 0.97 02/03/2020   HGBA1C 5.7 02/03/2020    MR LUMBAR SPINE WO CONTRAST  Result Date: 07/13/2019 CLINICAL DATA:  Severe low back pain over the last 6 weeks. Right-sided weakness and numbness. EXAM: MRI LUMBAR SPINE WITHOUT CONTRAST TECHNIQUE: Multiplanar, multisequence MR imaging of the lumbar spine was performed. No intravenous contrast was administered. COMPARISON:  None. FINDINGS: Segmentation:  5 lumbar type vertebral bodies. Alignment:  3 mm anterolisthesis L4-5. Vertebrae:  No fracture or primary bone lesion. Conus medullaris and cauda equina: Conus extends to the L1 level. Conus and cauda equina appear normal. Paraspinal and other soft tissues: Negative Disc levels: No abnormality at L2-3 or above. L3-4: Shallow protrusion of the disc. Mild facet hypertrophy. Mild narrowing of the lateral recesses but no visible neural compression. L4-5: Advanced bilateral facet arthropathy with edematous change worse on the right than the left. 3 mm anterolisthesis because of this. Bulging of the disc. Stenosis of both lateral recesses and neural foramina that could cause neural compression on either or both sides. L5-S1: Bulging of the disc. Mild facet osteoarthritis. No canal or foraminal stenosis. IMPRESSION: The dominant findings are at L4-5. There is advanced bilateral facet  arthropathy with edematous change, worse on the right than the left. This allows anterolisthesis of 3 mm. There is disc degeneration with bulging of the disc. There is stenosis of both lateral recesses and neural foramina that could cause neural compression on either or both sides. L3-4: Shallow disc protrusion. Mild facet hypertrophy. No apparent compressive stenosis. L5-S1: Mild disc bulge and facet osteoarthritis. No compressive stenosis. Electronically Signed   By: Nelson Chimes M.D.   On: 07/13/2019 11:22    Assessment & Plan:  There are no diagnoses linked to this encounter.   No orders of the defined types were placed in this encounter.    Follow-up: No follow-ups on file.  Walker Kehr, MD

## 2020-08-18 ENCOUNTER — Ambulatory Visit: Payer: 59 | Admitting: Cardiology

## 2020-08-31 ENCOUNTER — Ambulatory Visit: Payer: Medicare Other | Admitting: Cardiology

## 2020-08-31 ENCOUNTER — Other Ambulatory Visit: Payer: Self-pay

## 2020-08-31 ENCOUNTER — Encounter: Payer: Self-pay | Admitting: Cardiology

## 2020-08-31 VITALS — BP 126/80 | HR 57 | Ht 72.0 in | Wt 254.0 lb

## 2020-08-31 DIAGNOSIS — E785 Hyperlipidemia, unspecified: Secondary | ICD-10-CM | POA: Diagnosis not present

## 2020-08-31 DIAGNOSIS — E7439 Other disorders of intestinal carbohydrate absorption: Secondary | ICD-10-CM | POA: Diagnosis not present

## 2020-08-31 DIAGNOSIS — I2584 Coronary atherosclerosis due to calcified coronary lesion: Secondary | ICD-10-CM | POA: Diagnosis not present

## 2020-08-31 DIAGNOSIS — I2510 Atherosclerotic heart disease of native coronary artery without angina pectoris: Secondary | ICD-10-CM | POA: Diagnosis not present

## 2020-08-31 NOTE — Progress Notes (Signed)
_   Date:  08/31/2020   ID:  KINNICK MAUS, DOB July 31, 1956, MRN [id number]   PCP:  Cassandria Anger, MD  Cardiologist:  NEW Electrophysiologist:  None   Chief Complaint:  Coronary artery calcifications  History of Present Illness:    Joe Williams is a 64 y.o. male  with a hx of HLD, tobacco use, glucose intolerance and family hx of CAD who underwent coronary calcium score to assess future risk for cardiovascular dz.  His mom died of an MI in her mid 17's. Coronary calcium score was 242.   He is here today for followup and is doing well.  He denies any chest pain or pressure, SOB, DOE, PND, orthopnea, LE edema, dizziness or syncope. He is compliant with his meds and is tolerating meds with no SE.  He tells me that shortly after the first COVID 19 shot he started noticing palpitations and says that his heart would be very fast and initially only lasted a few seconds but then started lasting longer up to 8-10 min.  He would take his BP and his HR would be as high as 150bpm.  It would spontaneously resolve on its own.  This seems to have subsided in the past month and has not had any further episodes.  He then got the second vaccine and had some palpitations but not has severe or frequent.    Prior CV studies:   The following studies were reviewed today:  Chest CT coronary Calcium score  Past Medical History:  Diagnosis Date  . COLONIC POLYPS, HX OF    rectal-hyperplastic only 2007  . DYSFUNCTION, SINOATRIAL NODE    chronic sinus brady  . GERD   . GLUCOSE INTOLERANCE   . History of shingles   . HYPERLIPIDEMIA   . Morbid obesity ([place])   . PLANTAR FASCIITIS   . TOBACCO USE, QUIT    Past Surgical History:  Procedure Laterality Date  . COLONOSCOPY    . ROTATOR CUFF REPAIR  1995 (approx)   right -Dr. Mayer Camel     Current Meds  Medication Sig  . aspirin EC 81 MG tablet Take 81 mg by mouth daily.    . cholecalciferol (VITAMIN D) 1000 UNITS tablet Take 1,000 Units by mouth daily.     . melatonin 5 MG TABS Take 5 mg by mouth.  . meloxicam (MOBIC) 15 MG tablet TAKE 1 TABLET BY MOUTH EVERY DAY AS NEEDED FOR PAIN  . omeprazole (PRILOSEC) 40 MG capsule TAKE 1 CAPSULE BY MOUTH EVERY DAY  . POTASSIUM GLUCONATE PO Take 1 tablet by mouth daily.  . pravastatin (PRAVACHOL) 80 MG tablet Take 1 tablet (80 mg total) by mouth every evening.  . Saw Palmetto 160 MG TABS Take 1 tablet by mouth daily.  . tamsulosin (FLOMAX) 0.4 MG CAPS capsule Take 1 capsule (0.4 mg total) by mouth daily.  . vitamin C (ASCORBIC ACID) 500 MG tablet Take 500 mg by mouth daily.   Current Facility-Administered Medications for the 08/31/20 encounter (Office Visit) with Sueanne Margarita, MD  Medication  . 0.9 %  sodium chloride infusion     Allergies:   Lovastatin and Tetracycline   Social History   Tobacco Use  . Smoking status: Former Research scientist (life sciences)  . Smokeless tobacco: Never Used  Substance Use Topics  . Alcohol use: Yes    Alcohol/week: 3.0 standard drinks    Types: 3 Cans of beer per week    Comment: wknd  . Drug use:  No     Family Hx: The patient's family history includes Dementia in an other family member; Gallbladder disease in his mother; Heart disease in his mother; Ulcers in his father and sister. There is no history of Colon cancer.  ROS:   Please see the history of present illness.     All other systems reviewed and are negative.   Labs/Other Tests and Data Reviewed:    Recent Labs: 02/03/2020: ALT 24; BUN 22; Creatinine, Ser 0.87; Hemoglobin 14.0; Platelets 182.0; Potassium 4.3; Sodium 138; TSH 1.48   Recent Lipid Panel Lab Results  Component Value Date/Time   CHOL 143 02/03/2020 08:02 AM   CHOL 144 01/31/2020 08:21 AM   TRIG 101.0 02/03/2020 08:02 AM   HDL 44.40 02/03/2020 08:02 AM   HDL 53 01/31/2020 08:21 AM   CHOLHDL 3 02/03/2020 08:02 AM   LDLCALC 78 02/03/2020 08:02 AM   LDLCALC 73 01/31/2020 08:21 AM    Wt Readings from Last 3 Encounters:  08/31/20 254 lb (115.2 kg)   02/10/20 249 lb (112.9 kg)  08/20/19 245 lb (111.1 kg)     Objective:    Vital Signs:  BP 126/80   Pulse (!) 57   Ht 6' (1.829 m)   Wt 254 lb (115.2 kg)   SpO2 98%   BMI 34.45 kg/m    GEN: Well nourished, well developed in no acute distress HEENT: Normal NECK: No JVD; No carotid bruits LYMPHATICS: No lymphadenopathy CARDIAC:RRR, no murmurs, rubs, gallops RESPIRATORY:  Clear to auscultation without rales, wheezing or rhonchi  ABDOMEN: Soft, non-tender, non-distended MUSCULOSKELETAL:  No edema; No deformity  SKIN: Warm and dry NEUROLOGIC:  Alert and oriented x 3 PSYCHIATRIC:  Normal affect    ASSESSMENT & PLAN:    1.  Coronary artery calcifications -noted on Chest CT at 272.   -he denies any anginal symptoms -lexiscan myoview 08/2019 with no ischemia -LDL goal  < 70 -HbA1C goal < 6.5%. -continue ASA and statin  2.  HLD -LDL 78 in March 2021 and goal < 70 -check LFP and ALT  -continue pravastatin 80mg  daily  3.  Glucose intolerance -HbA1C at goal at 5.7% in March 2021.  4.  Palpitations -these occurred after the first COVID 19 vaccine and a few times after the second vaccine but since has subsided. -I will get an event monitor to make sure he is not having PAF   Medication Adjustments/Labs and Tests Ordered: Current medicines are reviewed at length with the patient today.  Concerns regarding medicines are outlined above.  Medication changes, Labs and Tests ordered today are listed in the Patient Instructions below.  There are no Patient Instructions on file for this visit.   Signed, Fransico Him, MD  08/31/2020 9:15 AM    [address], [place], [place]  [address] Phone: [phone]; Fax: [phone]

## 2020-08-31 NOTE — Patient Instructions (Signed)
Medication Instructions:  Your physician recommends that you continue on your current medications as directed. Please refer to the Current Medication list given to you today.  *If you need a refill on your cardiac medications before your next appointment, please call your pharmacy*   Lab Work: Fasting lipids and ALT  If you have labs (blood work) drawn today and your tests are completely normal, you will receive your results only by: . MyChart Message (if you have MyChart) OR . A paper copy in the mail If you have any lab test that is abnormal or we need to change your treatment, we will call you to review the results.   Follow-Up: At [facility], you and your health needs are our priority.  As part of our continuing mission to provide you with exceptional heart care, we have created designated Provider Care Teams.  These Care Teams include your primary Cardiologist (physician) and Advanced Practice Providers (APPs -  Physician Assistants and Nurse Practitioners) who all work together to provide you with the care you need, when you need it.  Your next appointment:   1 year(s)  The format for your next appointment:   In Person  Provider:   You may see Traci Turner, MD or one of the following Advanced Practice Providers on your designated Care Team:    Dayna Dunn, PA-C  Michele Lenze, PA-C    

## 2020-08-31 NOTE — Addendum Note (Signed)
Addended by: Antonieta Iba on: 08/31/2020 09:24 AM   Modules accepted: Orders

## 2020-09-08 ENCOUNTER — Other Ambulatory Visit: Payer: Self-pay

## 2020-09-08 ENCOUNTER — Other Ambulatory Visit: Payer: Medicare Other | Admitting: *Deleted

## 2020-09-08 DIAGNOSIS — I2510 Atherosclerotic heart disease of native coronary artery without angina pectoris: Secondary | ICD-10-CM

## 2020-09-08 DIAGNOSIS — I2584 Coronary atherosclerosis due to calcified coronary lesion: Secondary | ICD-10-CM

## 2020-09-08 DIAGNOSIS — E785 Hyperlipidemia, unspecified: Secondary | ICD-10-CM

## 2020-09-08 LAB — LIPID PANEL
Chol/HDL Ratio: 2.8 ratio (ref 0.0–5.0)
Cholesterol, Total: 136 mg/dL (ref 100–199)
HDL: 48 mg/dL (ref >39)
LDL Chol Calc (NIH): 73 mg/dL (ref 0–99)
Triglycerides: 73 mg/dL (ref 0–149)
VLDL Cholesterol Cal: 15 mg/dL (ref 5–40)

## 2020-09-08 LAB — ALT: ALT: 23 IU/L (ref 0–44)

## 2020-09-20 ENCOUNTER — Other Ambulatory Visit: Payer: Self-pay | Admitting: Internal Medicine

## 2020-12-02 ENCOUNTER — Other Ambulatory Visit: Payer: Self-pay | Admitting: Internal Medicine

## 2020-12-10 ENCOUNTER — Other Ambulatory Visit (INDEPENDENT_AMBULATORY_CARE_PROVIDER_SITE_OTHER): Payer: Medicare Other

## 2020-12-10 DIAGNOSIS — E739 Lactose intolerance, unspecified: Secondary | ICD-10-CM | POA: Diagnosis not present

## 2020-12-10 DIAGNOSIS — Z0000 Encounter for general adult medical examination without abnormal findings: Secondary | ICD-10-CM | POA: Diagnosis not present

## 2020-12-10 LAB — CBC WITH DIFFERENTIAL/PLATELET
Basophils Absolute: 0.1 10*3/uL (ref 0.0–0.1)
Basophils Relative: 1.3 % (ref 0.0–3.0)
Eosinophils Absolute: 0.2 10*3/uL (ref 0.0–0.7)
Eosinophils Relative: 3.7 % (ref 0.0–5.0)
HCT: 42.4 % (ref 39.0–52.0)
Hemoglobin: 14.2 g/dL (ref 13.0–17.0)
Lymphocytes Relative: 36.3 % (ref 12.0–46.0)
Lymphs Abs: 1.6 10*3/uL (ref 0.7–4.0)
MCHC: 33.5 g/dL (ref 30.0–36.0)
MCV: 86.9 fl (ref 78.0–100.0)
Monocytes Absolute: 0.3 10*3/uL (ref 0.1–1.0)
Monocytes Relative: 7.7 % (ref 3.0–12.0)
Neutro Abs: 2.2 10*3/uL (ref 1.4–7.7)
Neutrophils Relative %: 51 % (ref 43.0–77.0)
Platelets: 169 10*3/uL (ref 150.0–400.0)
RBC: 4.88 Mil/uL (ref 4.22–5.81)
RDW: 13.5 % (ref 11.5–15.5)
WBC: 4.4 10*3/uL (ref 4.0–10.5)

## 2020-12-10 LAB — HEPATIC FUNCTION PANEL
ALT: 25 U/L (ref 0–53)
AST: 17 U/L (ref 0–37)
Albumin: 4.2 g/dL (ref 3.5–5.2)
Alkaline Phosphatase: 30 U/L — ABNORMAL LOW (ref 39–117)
Bilirubin, Direct: 0.1 mg/dL (ref 0.0–0.3)
Total Bilirubin: 0.6 mg/dL (ref 0.2–1.2)
Total Protein: 6.2 g/dL (ref 6.0–8.3)

## 2020-12-10 LAB — URINALYSIS
Bilirubin Urine: NEGATIVE
Hgb urine dipstick: NEGATIVE
Ketones, ur: NEGATIVE
Leukocytes,Ua: NEGATIVE
Nitrite: NEGATIVE
Specific Gravity, Urine: >=1.03 — AB (ref 1.000–1.030)
Total Protein, Urine: NEGATIVE
Urine Glucose: NEGATIVE
Urobilinogen, UA: 0.2 (ref 0.0–1.0)
pH: 6 (ref 5.0–8.0)

## 2020-12-10 LAB — BASIC METABOLIC PANEL
BUN: 24 mg/dL — ABNORMAL HIGH (ref 6–23)
CO2: 29 mEq/L (ref 19–32)
Calcium: 9.1 mg/dL (ref 8.4–10.5)
Chloride: 104 mEq/L (ref 96–112)
Creatinine, Ser: 0.97 mg/dL (ref 0.40–1.50)
GFR: 81.99 mL/min (ref >60.00)
Glucose, Bld: 111 mg/dL — ABNORMAL HIGH (ref 70–99)
Potassium: 4.8 mEq/L (ref 3.5–5.1)
Sodium: 138 mEq/L (ref 135–145)

## 2020-12-10 LAB — PSA: PSA: 1.24 ng/mL (ref 0.10–4.00)

## 2020-12-10 LAB — TSH: TSH: 1.38 u[IU]/mL (ref 0.35–4.50)

## 2020-12-10 LAB — HEMOGLOBIN A1C: Hgb A1c MFr Bld: 6.1 % (ref 4.6–6.5)

## 2020-12-14 ENCOUNTER — Telehealth: Payer: Self-pay | Admitting: Internal Medicine

## 2020-12-14 NOTE — Telephone Encounter (Signed)
   Patient calling to discuss lab results.  Patient is concerned that cholesterol lab was "trashed" while at the lab He states a staff member/ student did not collect for lipid panel

## 2020-12-16 ENCOUNTER — Other Ambulatory Visit: Payer: Self-pay | Admitting: Internal Medicine

## 2020-12-16 NOTE — Telephone Encounter (Signed)
Pt has CPX 12/22/20 MD will discuss at that time.Marland KitchenJohny Chess

## 2020-12-21 ENCOUNTER — Other Ambulatory Visit: Payer: Self-pay

## 2020-12-22 ENCOUNTER — Ambulatory Visit (INDEPENDENT_AMBULATORY_CARE_PROVIDER_SITE_OTHER): Payer: Medicare Other | Admitting: Internal Medicine

## 2020-12-22 ENCOUNTER — Encounter: Payer: Self-pay | Admitting: Internal Medicine

## 2020-12-22 VITALS — BP 132/84 | HR 62 | Temp 98.2°F | Ht 72.0 in | Wt 259.2 lb

## 2020-12-22 DIAGNOSIS — I2510 Atherosclerotic heart disease of native coronary artery without angina pectoris: Secondary | ICD-10-CM

## 2020-12-22 DIAGNOSIS — G8929 Other chronic pain: Secondary | ICD-10-CM

## 2020-12-22 DIAGNOSIS — Z0000 Encounter for general adult medical examination without abnormal findings: Secondary | ICD-10-CM | POA: Diagnosis not present

## 2020-12-22 DIAGNOSIS — Z23 Encounter for immunization: Secondary | ICD-10-CM

## 2020-12-22 DIAGNOSIS — R739 Hyperglycemia, unspecified: Secondary | ICD-10-CM

## 2020-12-22 DIAGNOSIS — M545 Low back pain, unspecified: Secondary | ICD-10-CM

## 2020-12-22 DIAGNOSIS — I2583 Coronary atherosclerosis due to lipid rich plaque: Secondary | ICD-10-CM

## 2020-12-22 NOTE — Assessment & Plan Note (Addendum)
B LE pain/LBP - spinal stenosis  Dr Mayer Camel Injections MRI IMPRESSION: The dominant findings are at L4-5. There is advanced bilateral facet arthropathy with edematous change, worse on the right than the left. This allows anterolisthesis of 3 mm. There is disc degeneration with bulging of the disc. There is stenosis of both lateral recesses and neural foramina that could cause neural compression on either or both sides.  L3-4: Shallow disc protrusion. Mild facet hypertrophy. No apparent compressive stenosis.  L5-S1: Mild disc bulge and facet osteoarthritis. No compressive stenosis.   Electronically Signed   By: Nelson Chimes M.D.   On: 07/13/2019 11:22

## 2020-12-22 NOTE — Progress Notes (Signed)
Subjective:  Patient ID: Joe Williams, male    DOB: 1956-05-03  Age: 65 y.o. MRN: [id number]  CC: Annual Exam   HPI Joe Williams presents for a well exam Patient had lab work done 2 weeks ago he gained weight due to back pain; he had spinal injections -doing well now He had palpitations after both Covid shots, they resolved   Outpatient Medications Prior to Visit  Medication Sig Dispense Refill  . aspirin EC 81 MG tablet Take 81 mg by mouth daily.    . cholecalciferol (VITAMIN D) 1000 UNITS tablet Take 1,000 Units by mouth daily.    . melatonin 5 MG TABS Take 5 mg by mouth.    . meloxicam (MOBIC) 15 MG tablet TAKE 1 TABLET BY MOUTH EVERY DAY AS NEEDED FOR PAIN 90 tablet 0  . omeprazole (PRILOSEC) 40 MG capsule Take 1 capsule (40 mg total) by mouth daily. Annual appt due in March must see provider for future refills 90 capsule 0  . POTASSIUM GLUCONATE PO Take 1 tablet by mouth daily.    . pravastatin (PRAVACHOL) 80 MG tablet Take 1 tablet (80 mg total) by mouth every evening. 90 tablet 3  . Saw Palmetto 160 MG TABS Take 1 tablet by mouth daily.    . tamsulosin (FLOMAX) 0.4 MG CAPS capsule Take 1 capsule (0.4 mg total) by mouth daily. 90 capsule 3  . vitamin C (ASCORBIC ACID) 500 MG tablet Take 500 mg by mouth daily.    [name] 0.9 %  sodium chloride infusion      No facility-administered medications prior to visit.    ROS: Review of Systems  Constitutional: Positive for unexpected weight change. Negative for appetite change and fatigue.  HENT: Negative for congestion, nosebleeds, sneezing, sore throat and trouble swallowing.   Eyes: Negative for itching and visual disturbance.  Respiratory: Negative for cough.   Cardiovascular: Positive for palpitations. Negative for chest pain and leg swelling.  Gastrointestinal: Negative for abdominal distention, blood in stool, diarrhea and nausea.  Genitourinary: Negative for frequency and hematuria.  Musculoskeletal: Positive for back pain.  Negative for gait problem, joint swelling and neck pain.  Skin: Negative for rash.  Neurological: Negative for dizziness, tremors, speech difficulty and weakness.  Psychiatric/Behavioral: Negative for agitation, dysphoric mood and sleep disturbance. The patient is not nervous/anxious.     Objective:  BP 132/84 (BP Location: Left Arm)   Pulse 62   Temp 98.2 F (36.8 C) (Oral)   Ht 6' (1.829 m)   Wt 259 lb 3.2 oz (117.6 kg)   SpO2 96%   BMI 35.15 kg/m   BP Readings from Last 3 Encounters:  12/22/20 132/84  08/31/20 126/80  02/10/20 126/74    Wt Readings from Last 3 Encounters:  12/22/20 259 lb 3.2 oz (117.6 kg)  08/31/20 254 lb (115.2 kg)  02/10/20 249 lb (112.9 kg)    Physical Exam Constitutional:      General: He is not in acute distress.    Appearance: He is well-developed. He is obese.     Comments: NAD  HENT:     Mouth/Throat:     Mouth: Oropharynx is clear and moist.  Eyes:     Conjunctiva/sclera: Conjunctivae normal.     Pupils: Pupils are equal, round, and reactive to light.  Neck:     Thyroid: No thyromegaly.     Vascular: No JVD.  Cardiovascular:     Rate and Rhythm: Normal rate and regular rhythm.  Pulses: Intact distal pulses.     Heart sounds: Normal heart sounds. No murmur heard. No friction rub. No gallop.   Pulmonary:     Effort: Pulmonary effort is normal. No respiratory distress.     Breath sounds: Normal breath sounds. No wheezing or rales.  Chest:     Chest wall: No tenderness.  Abdominal:     General: Bowel sounds are normal. There is no distension.     Palpations: Abdomen is soft. There is no mass.     Tenderness: There is no abdominal tenderness. There is no guarding or rebound.  Genitourinary:    Rectum: Normal. Guaiac result negative.  Musculoskeletal:        General: No edema. Normal range of motion.     Cervical back: Normal range of motion. Tenderness present.  Lymphadenopathy:     Cervical: No cervical adenopathy.  Skin:     General: Skin is warm and dry.     Findings: No rash.  Neurological:     Mental Status: He is alert and oriented to person, place, and time.     Cranial Nerves: No cranial nerve deficit.     Motor: No abnormal muscle tone.     Coordination: He displays a negative Romberg sign. Coordination normal.     Gait: Gait normal.     Deep Tendon Reflexes: Reflexes are normal and symmetric.  Psychiatric:        Mood and Affect: Mood and affect normal.        Behavior: Behavior normal.        Thought Content: Thought content normal.        Judgment: Judgment normal.   Prostate 1+  Lab Results  Component Value Date   WBC 4.4 12/10/2020   HGB 14.2 12/10/2020   HCT 42.4 12/10/2020   PLT 169.0 12/10/2020   GLUCOSE 111 (H) 12/10/2020   CHOL 136 09/08/2020   TRIG 73 09/08/2020   HDL 48 09/08/2020   LDLCALC 73 09/08/2020   ALT 25 12/10/2020   AST 17 12/10/2020   NA 138 12/10/2020   K 4.8 12/10/2020   CL 104 12/10/2020   CREATININE 0.97 12/10/2020   BUN 24 (H) 12/10/2020   CO2 29 12/10/2020   TSH 1.38 12/10/2020   PSA 1.24 12/10/2020   HGBA1C 6.1 12/10/2020    MR LUMBAR SPINE WO CONTRAST  Result Date: 07/13/2019 CLINICAL DATA:  Severe low back pain over the last 6 weeks. Right-sided weakness and numbness. EXAM: MRI LUMBAR SPINE WITHOUT CONTRAST TECHNIQUE: Multiplanar, multisequence MR imaging of the lumbar spine was performed. No intravenous contrast was administered. COMPARISON:  None. FINDINGS: Segmentation:  5 lumbar type vertebral bodies. Alignment:  3 mm anterolisthesis L4-5. Vertebrae:  No fracture or primary bone lesion. Conus medullaris and cauda equina: Conus extends to the L1 level. Conus and cauda equina appear normal. Paraspinal and other soft tissues: Negative Disc levels: No abnormality at L2-3 or above. L3-4: Shallow protrusion of the disc. Mild facet hypertrophy. Mild narrowing of the lateral recesses but no visible neural compression. L4-5: Advanced bilateral facet  arthropathy with edematous change worse on the right than the left. 3 mm anterolisthesis because of this. Bulging of the disc. Stenosis of both lateral recesses and neural foramina that could cause neural compression on either or both sides. L5-S1: Bulging of the disc. Mild facet osteoarthritis. No canal or foraminal stenosis. IMPRESSION: The dominant findings are at L4-5. There is advanced bilateral facet arthropathy with edematous change, worse on  the right than the left. This allows anterolisthesis of 3 mm. There is disc degeneration with bulging of the disc. There is stenosis of both lateral recesses and neural foramina that could cause neural compression on either or both sides. L3-4: Shallow disc protrusion. Mild facet hypertrophy. No apparent compressive stenosis. L5-S1: Mild disc bulge and facet osteoarthritis. No compressive stenosis. Electronically Signed   By: Nelson Chimes M.D.   On: 07/13/2019 11:22    Assessment & Plan:     Follow-up: No follow-ups on file.  Walker Kehr, MD

## 2020-12-22 NOTE — Assessment & Plan Note (Signed)
F/u Dr Radford Pax Pravastatin, ASA

## 2020-12-22 NOTE — Assessment & Plan Note (Signed)
Worse Loose wt, stay active

## 2020-12-22 NOTE — Patient Instructions (Signed)
For a mild COVID-19 case you can take zinc 50 mg a day for 1 week, vitamin C 1000 mg daily for 1 week, vitamin D2 50,000 units weekly for 2 months (unless you are taking vitamin D daily already), Quercetin 500 mg twice a day for 1 week (if you can get it quick enough).  Maintain good oral hydration and take Tylenol for high fever.   You can buy COVID-19 express tests for home use at [facility] or [facility] for $9.99.   They are less expensive online, roughly $15 for 2 tests.  There is a link below: [email or web address]  You can order 4 free COVID-19 home tests via [employer]

## 2020-12-22 NOTE — Addendum Note (Signed)
Addended by: Earnstine Regal on: 12/22/2020 09:27 AM   Modules accepted: Orders

## 2020-12-22 NOTE — Assessment & Plan Note (Signed)
  We discussed age appropriate health related issues, including available/recomended screening tests and vaccinations. Labs were ordered to be later reviewed . All questions were answered. We discussed one or more of the following - seat belt use, use of sunscreen/sun exposure exercise, safe sex, fall risk reduction, second hand smoke exposure, firearm use and storage, seat belt use, a need for adhering to healthy diet and exercise. Labs were ordered.  All questions were answered.   Colon 1/18, due in 1/28  2020 A cardiac CT scan for calcium scoring IMPRESSION: Coronary calcium score of 242. This was 75th percentile for age and sex matched control.

## 2021-02-13 ENCOUNTER — Other Ambulatory Visit: Payer: Self-pay | Admitting: Internal Medicine

## 2021-02-27 ENCOUNTER — Other Ambulatory Visit: Payer: Self-pay | Admitting: Internal Medicine

## 2021-03-16 ENCOUNTER — Other Ambulatory Visit: Payer: Self-pay | Admitting: Internal Medicine

## 2021-05-02 ENCOUNTER — Encounter: Payer: Self-pay | Admitting: Emergency Medicine

## 2021-05-02 ENCOUNTER — Emergency Department: Payer: Medicare Other

## 2021-05-02 ENCOUNTER — Observation Stay
Admission: EM | Admit: 2021-05-02 | Discharge: 2021-05-03 | Disposition: A | Discharge status: Home / Self Care | Payer: Medicare Other | Attending: Internal Medicine | Admitting: Internal Medicine

## 2021-05-02 ENCOUNTER — Other Ambulatory Visit: Payer: Self-pay

## 2021-05-02 DIAGNOSIS — K2100 Gastro-esophageal reflux disease with esophagitis, without bleeding: Secondary | ICD-10-CM

## 2021-05-02 DIAGNOSIS — Z20822 Contact with and (suspected) exposure to covid-19: Secondary | ICD-10-CM | POA: Insufficient documentation

## 2021-05-02 DIAGNOSIS — Z7982 Long term (current) use of aspirin: Secondary | ICD-10-CM | POA: Diagnosis not present

## 2021-05-02 DIAGNOSIS — R1011 Right upper quadrant pain: Secondary | ICD-10-CM

## 2021-05-02 DIAGNOSIS — R1013 Epigastric pain: Principal | ICD-10-CM | POA: Insufficient documentation

## 2021-05-02 DIAGNOSIS — Z87891 Personal history of nicotine dependence: Secondary | ICD-10-CM | POA: Insufficient documentation

## 2021-05-02 DIAGNOSIS — N401 Enlarged prostate with lower urinary tract symptoms: Secondary | ICD-10-CM | POA: Diagnosis not present

## 2021-05-02 DIAGNOSIS — K759 Inflammatory liver disease, unspecified: Secondary | ICD-10-CM | POA: Diagnosis not present

## 2021-05-02 DIAGNOSIS — I2510 Atherosclerotic heart disease of native coronary artery without angina pectoris: Secondary | ICD-10-CM | POA: Diagnosis not present

## 2021-05-02 DIAGNOSIS — Y900 Blood alcohol level of less than 20 mg/100 ml: Secondary | ICD-10-CM | POA: Insufficient documentation

## 2021-05-02 DIAGNOSIS — R109 Unspecified abdominal pain: Secondary | ICD-10-CM | POA: Diagnosis present

## 2021-05-02 DIAGNOSIS — R3911 Hesitancy of micturition: Secondary | ICD-10-CM | POA: Diagnosis not present

## 2021-05-02 DIAGNOSIS — Z79899 Other long term (current) drug therapy: Secondary | ICD-10-CM | POA: Diagnosis not present

## 2021-05-02 DIAGNOSIS — R7401 Elevation of levels of liver transaminase levels: Secondary | ICD-10-CM | POA: Insufficient documentation

## 2021-05-02 LAB — BASIC METABOLIC PANEL
Anion gap: 9 (ref 5–15)
BUN: 22 mg/dL (ref 8–23)
CO2: 24 mmol/L (ref 22–32)
Calcium: 9.1 mg/dL (ref 8.9–10.3)
Chloride: 105 mmol/L (ref 98–111)
Creatinine, Ser: 0.95 mg/dL (ref 0.61–1.24)
GFR, Estimated: >60 mL/min (ref >60)
Glucose, Bld: 143 mg/dL — ABNORMAL HIGH (ref 70–99)
Potassium: 4.3 mmol/L (ref 3.5–5.1)
Sodium: 138 mmol/L (ref 135–145)

## 2021-05-02 LAB — CBC
HCT: 42.7 % (ref 39.0–52.0)
Hemoglobin: 14.5 g/dL (ref 13.0–17.0)
MCH: 29.5 pg (ref 26.0–34.0)
MCHC: 34 g/dL (ref 30.0–36.0)
MCV: 87 fL (ref 80.0–100.0)
Platelets: 187 10*3/uL (ref 150–400)
RBC: 4.91 MIL/uL (ref 4.22–5.81)
RDW: 13.2 % (ref 11.5–15.5)
WBC: 7.4 10*3/uL (ref 4.0–10.5)
nRBC: 0 % (ref 0.0–0.2)

## 2021-05-02 LAB — LIPASE, BLOOD: Lipase: 34 U/L (ref 11–51)

## 2021-05-02 LAB — URINE DRUG SCREEN, QUALITATIVE (ARMC ONLY)
Amphetamines, Ur Screen: NOT DETECTED
Barbiturates, Ur Screen: NOT DETECTED
Benzodiazepine, Ur Scrn: NOT DETECTED
Cannabinoid 50 Ng, Ur ~~LOC~~: NOT DETECTED
Cocaine Metabolite,Ur ~~LOC~~: NOT DETECTED
MDMA (Ecstasy)Ur Screen: NOT DETECTED
Methadone Scn, Ur: NOT DETECTED
Opiate, Ur Screen: NOT DETECTED
Phencyclidine (PCP) Ur S: NOT DETECTED
Tricyclic, Ur Screen: NOT DETECTED

## 2021-05-02 LAB — TROPONIN I (HIGH SENSITIVITY)
Troponin I (High Sensitivity): 3 ng/L (ref <18)
Troponin I (High Sensitivity): 2 ng/L (ref <18)

## 2021-05-02 LAB — HEPATIC FUNCTION PANEL
ALT: 179 U/L — ABNORMAL HIGH (ref 0–44)
ALT: 597 U/L — ABNORMAL HIGH (ref 0–44)
AST: 260 U/L — ABNORMAL HIGH (ref 15–41)
AST: 662 U/L — ABNORMAL HIGH (ref 15–41)
Albumin: 4.5 g/dL (ref 3.5–5.0)
Albumin: 4.3 g/dL (ref 3.5–5.0)
Alkaline Phosphatase: 42 U/L (ref 38–126)
Alkaline Phosphatase: 48 U/L (ref 38–126)
Bilirubin, Direct: 0.9 mg/dL — ABNORMAL HIGH (ref 0.0–0.2)
Bilirubin, Direct: 2 mg/dL — ABNORMAL HIGH (ref 0.0–0.2)
Indirect Bilirubin: 1.1 mg/dL — ABNORMAL HIGH (ref 0.3–0.9)
Indirect Bilirubin: 1.9 mg/dL — ABNORMAL HIGH (ref 0.3–0.9)
Total Bilirubin: 2 mg/dL — ABNORMAL HIGH (ref 0.3–1.2)
Total Bilirubin: 3.9 mg/dL — ABNORMAL HIGH (ref 0.3–1.2)
Total Protein: 7.2 g/dL (ref 6.5–8.1)
Total Protein: 7.2 g/dL (ref 6.5–8.1)

## 2021-05-02 LAB — ETHANOL: Alcohol, Ethyl (B): <10 mg/dL (ref <10)

## 2021-05-02 LAB — HEPATITIS PANEL, ACUTE
HCV Ab: NONREACTIVE
Hep A IgM: NONREACTIVE
Hep B C IgM: NONREACTIVE
Hepatitis B Surface Ag: NONREACTIVE

## 2021-05-02 LAB — HIV ANTIBODY (ROUTINE TESTING W REFLEX): HIV Screen 4th Generation wRfx: NONREACTIVE

## 2021-05-02 LAB — CK: Total CK: 200 U/L (ref 49–397)

## 2021-05-02 LAB — ACETAMINOPHEN LEVEL: Acetaminophen (Tylenol), Serum: <10 ug/mL — ABNORMAL LOW (ref 10–30)

## 2021-05-02 MED ORDER — IBUPROFEN 400 MG PO TABS
400.0000 mg | ORAL_TABLET | Freq: Four times a day (QID) | ORAL | Status: DC | PRN
  Administered 2021-05-03: 400 mg via ORAL
  Filled 2021-05-02: qty 1

## 2021-05-02 MED ORDER — KETOROLAC TROMETHAMINE 15 MG/ML IJ SOLN
15.0000 mg | Freq: Three times a day (TID) | INTRAMUSCULAR | Status: DC | PRN
  Filled 2021-05-02: qty 1

## 2021-05-02 MED ORDER — VITAMIN D 25 MCG (1000 UNIT) PO TABS
1000.0000 [IU] | ORAL_TABLET | Freq: Every day | ORAL | Status: DC
  Administered 2021-05-02 – 2021-05-03 (×2): 1000 [IU] via ORAL
  Filled 2021-05-02 (×2): qty 1

## 2021-05-02 MED ORDER — TAMSULOSIN HCL 0.4 MG PO CAPS
0.4000 mg | ORAL_CAPSULE | Freq: Every day | ORAL | Status: DC
  Administered 2021-05-02 – 2021-05-03 (×2): 0.4 mg via ORAL
  Filled 2021-05-02 (×2): qty 1

## 2021-05-02 MED ORDER — PANTOPRAZOLE SODIUM 40 MG PO TBEC
80.0000 mg | DELAYED_RELEASE_TABLET | Freq: Every day | ORAL | Status: DC
  Administered 2021-05-02 – 2021-05-03 (×2): 80 mg via ORAL
  Filled 2021-05-02 (×2): qty 2

## 2021-05-02 MED ORDER — ASCORBIC ACID 500 MG PO TABS
500.0000 mg | ORAL_TABLET | Freq: Every day | ORAL | Status: DC
  Administered 2021-05-02 – 2021-05-03 (×2): 500 mg via ORAL
  Filled 2021-05-02 (×2): qty 1

## 2021-05-02 MED ORDER — ASPIRIN EC 81 MG PO TBEC
81.0000 mg | DELAYED_RELEASE_TABLET | Freq: Every day | ORAL | Status: DC
  Administered 2021-05-02 – 2021-05-03 (×2): 81 mg via ORAL
  Filled 2021-05-02 (×2): qty 1

## 2021-05-02 MED ORDER — ONDANSETRON HCL 4 MG PO TABS
4.0000 mg | ORAL_TABLET | Freq: Four times a day (QID) | ORAL | Status: DC | PRN
Start: 2021-05-02 — End: 2021-05-04

## 2021-05-02 MED ORDER — LORATADINE 10 MG PO TABS: 10.0000 mg | ORAL_TABLET | Freq: Every day | ORAL | Status: DC | PRN

## 2021-05-02 MED ORDER — ONDANSETRON HCL 4 MG/2ML IJ SOLN
4.0000 mg | Freq: Four times a day (QID) | INTRAMUSCULAR | Status: DC | PRN
  Administered 2021-05-02: 4 mg via INTRAVENOUS
  Filled 2021-05-02: qty 2

## 2021-05-02 MED ORDER — IOHEXOL 350 MG/ML SOLN
100.0000 mL | Freq: Once | INTRAVENOUS | Status: AC | PRN
  Administered 2021-05-02: 100 mL via INTRAVENOUS

## 2021-05-02 MED ORDER — MELATONIN 5 MG PO TABS
10.0000 mg | ORAL_TABLET | Freq: Every day | ORAL | Status: DC
  Administered 2021-05-02: 10 mg via ORAL
  Filled 2021-05-02 (×2): qty 2

## 2021-05-02 NOTE — H&P (Signed)
History and Physical   Joe Williams FBP:[id number] DOB: [birth year] DOA: 05/02/2021  PCP: Cassandria Anger, MD  Outpatient Specialists: Dr. Radford Pax, [facility] Patient coming from: home  I have personally briefly reviewed patient's old medical records in [place].  Chief Concern: Chest pain  HPI: Joe Williams is a 65 y.o. male with medical history significant for [facility] spotted fever in 2000 and 2005, hyperlipidemia, tobacco use, BPH, GERD, glucose intolerance, family history of CAD, presents to the emergency department for chief concerns of chest pain.  He describes the chest pain as in the epigastric region just inferior to the xiphoid process, radiating to back, between his shoulder blades, spreading to bilateral upper shoulders and bilateral abdominal sides.   He reports that the pain woke him up from sleep at approximately 5:15 AM, 10/10, sharp, lasting seconds, intermittent, worse with sitting down and laying down, and improved with standing up.   Currently, the pain is 2-3/10 in the epigastric region and the pain between his shoulder blade is 3-4/10.  Mr. Minerd does not know what made the pain improve. He states he has never experienced pain like this before and he denies trauma to his person.   He has been working outside doing yard work cleaning storm damages in the sun on the following dates: 6/17 and 6/18.   He endorses nausea and vomiting at approximately at 10 AM and 12 after the x ray while in the emergency department. He endorses that he had coffee with cream at home and promptly vomited up the light tan color of creamed coffee. The latter two episodes of vomiting was after xray was yellow bile. He denies bright red blood vomiting and coffee ground substances.  He denies changes to his diet.   He endorses recent seafood ingestion.  He ate shrimp that he purchased from [place], [place] in early spring and froze it. He's eaten 10 batches of these shrimp  with the last batch consumed on Thursday evening, 04/29/21.   He reports no new pets, they have had two dogs for 9 years and reports no recent illness. No one else in his family is having recent illness.   He has never had blood transfusion.  Social history: He lives with his spouse at home. He denies tobacco use currently.  Formally, he smoked 0.5 to 1 ppd, started at age 62.  He quit tobacco use about 40 years ago.  He infrequently drinks etoh, 5-6 twelve oz beers per week and if they have friends over, his etoh consumption goes up to 8 (twelve zo) beers per week. He denies recreational drug use. 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  Vaccinations: he is vaccinated for COVID 19, first shot is Estate manager/land agent and the second Photographer. He reports that after each of these vaccine shots, he experienced profound elevated heart rate and was advised by his PCP to not take additional doses.  ROS: Constitutional: no weight change, no fever ENT/Mouth: no sore throat, no rhinorrhea Eyes: no eye pain, no vision changes Cardiovascular: no chest pain, no dyspnea,  no edema, no palpitations Respiratory: no cough, no sputum, no wheezing Gastrointestinal: + nausea, + vomiting, no diarrhea, no constipation Genitourinary: no urinary incontinence, no dysuria, no hematuria Musculoskeletal: no arthralgias, no myalgias Skin: no skin lesions, no pruritus Neuro: no weakness, no loss of consciousness, no syncope Psych: no anxiety, no depression, + decrease appetite in summer months Heme/Lymph: no  bruising, no bleeding  ED Course: Discussed with ED provider, patient requiring hospitalization for chief concerns of elevated LFTs.  Vitals in the emergency department was remarkable for temperature 97.6, respiration rate of 18, heart rate of 64, blood pressure 146/81, SPO2 of 99% on room air.  High-sensitivity troponin were negative x2, initially 3 and decreased to  2.  Labs in the emergency department was initially remarkable for AST of 260, ALT 179, direct bili 0.9, indirect bili 1.1, total bili 2.0.  EDP spoke with GI specialist on-call, Dr. Marius Ditch who recommended repeat LFT.  On repeat labs, AST was elevated at 662, ALT 597, direct bili 2.0, indirect bili 1.9.  ED provider ordered a CTA of the chest/abdomen/pelvic for dissection with and without contrast which was read as: No CT evidence of thoracic or abdominal aortic dissection, no acute process identified within the chest, abdomen, pelvis.  Abdominal ultrasound was ordered and read as cholelithiasis without sonographic evidence of acute cholecystitis.  Normal appearance of the liver.  Assessment/Plan  Active Problems:   Abdominal pain   Abdominal pain-etiology work-up in progress as below -Lipase was negative, troponins high-sensitivity were negative x2 and downtrending -Pain control: Ibuprofen 400 mg p.o. every 6 hours.  For fever, headache, mild pain, ketorolac 15 mg IV every 8 hours for moderate pain  Elevated LFTs-etiology work-up in progress -patient takes saw palmetto daily and has been doing so for 10 years - In review of possible adverse effects from saw palmetto, may cause hepatotoxicity therefore I have advised patient to discontinue medication -  Acute hepatitis panel placed by EDP - We will check HIV, TSH, CK - Check EtOH level, acetaminophen level, UDS -Autoimmune: Anti-smooth antibody IgG, antinuclear antibody, antimicrosomal antibody  GERD-PPI  BPH-tamsulosin 0.4 milligrams daily  Chart reviewed.   DVT prophylaxis: ted hose Code Status: full code Diet: heart healthy Family Communication: Updated spouse at bedside Disposition Plan: Pending clinical course and GI evaluation Consults called: GI, Dr. Marius Ditch Admission status: MedSurg, observation, no telemetry  Past Medical History:  Diagnosis Date   COLONIC POLYPS, HX OF    rectal-hyperplastic only 2007    DYSFUNCTION, SINOATRIAL NODE    chronic sinus brady   GERD    GLUCOSE INTOLERANCE    History of shingles    HYPERLIPIDEMIA    Morbid obesity ([place])    PLANTAR FASCIITIS    TOBACCO USE, QUIT    Past Surgical History:  Procedure Laterality Date   COLONOSCOPY     [place] (approx)   right -Dr. Mayer Camel   Social History:  reports that he has quit smoking. He has never used smokeless tobacco. He reports current alcohol use of about 3.0 standard drinks of alcohol per week. He reports that he does not use drugs.  Allergies  Allergen Reactions   Lovastatin    Tetracycline    Family History  Problem Relation Age of Onset   Gallbladder disease Mother    Heart disease Mother        MI, irreg rhthm on coumadin   Ulcers Father    Ulcers Sister    Dementia Other        Multiple with dementia but lived to old age   Colon cancer Neg Hx    Family history: Family history reviewed and not pertinent  Prior to Admission medications   Medication Sig Start Date End Date Taking? Authorizing Provider  aspirin EC 81 MG tablet Take 81 mg by mouth daily.    [provider]  cholecalciferol (VITAMIN D) 1000 UNITS tablet Take 1,000 Units by mouth daily.    [provider]  melatonin 5 MG TABS Take 5 mg by mouth.    [provider]  meloxicam (MOBIC) 15 MG tablet TAKE 1 TABLET BY MOUTH EVERY DAY AS NEEDED FOR PAIN 03/16/21   Plotnikov, Evie Lacks, MD  omeprazole (PRILOSEC) 40 MG capsule Take 1 capsule (40 mg total) by mouth daily. 03/01/21   Plotnikov, Evie Lacks, MD  POTASSIUM GLUCONATE PO Take 1 tablet by mouth daily.    [provider]  pravastatin (PRAVACHOL) 80 MG tablet TAKE 1 TABLET BY MOUTH EVERY EVENING 03/16/21   Plotnikov, Evie Lacks, MD  Saw Palmetto 160 MG TABS Take 1 tablet by mouth daily.    [provider]  tamsulosin (FLOMAX) 0.4 MG CAPS capsule TAKE 1 CAPSULE BY MOUTH EVERY DAY 02/15/21   Plotnikov, Evie Lacks, MD  vitamin C (ASCORBIC  ACID) 500 MG tablet Take 500 mg by mouth daily.    [provider]    Physical Exam: Vitals:   05/02/21 1438 05/02/21 1439 05/02/21 1440 05/02/21 1600  BP: 139/88   136/79  Pulse: 71 71  69  Resp: 16  16 16   Temp:      TempSrc:      SpO2: 99% 99%  97%  Weight:      Height:       Constitutional: appears age-appropriate, NAD, calm, comfortable Eyes: PERRL, lids and conjunctivae normal ENMT: Mucous membranes are moist. Posterior pharynx clear of any exudate or lesions. Age-appropriate dentition. Hearing appropriate Neck: normal, supple, no masses, no thyromegaly Respiratory: clear to auscultation bilaterally, no wheezing, no crackles. Normal respiratory effort. No accessory muscle use.  Cardiovascular: Regular rate and rhythm, no murmurs / rubs / gallops. No extremity edema. 2+ pedal pulses. No carotid bruits.  Abdomen: no tenderness, no masses palpated, no hepatosplenomegaly. Bowel sounds positive.  Musculoskeletal: no clubbing / cyanosis. No joint deformity upper and lower extremities. Good ROM, no contractures, no atrophy. Normal muscle tone.  Skin: no rashes, lesions, ulcers. No induration Neurologic: Sensation intact. Strength 5/5 in all 4.  Psychiatric: Normal judgment and insight. Alert and oriented x 3. Normal mood.   EKG: independently reviewed, showing rhythm with rate of 66, QTc 417  Chest x-ray on Admission: I personally reviewed and I agree with radiologist reading as below.  DG Chest 2 View  Result Date: 05/02/2021 CLINICAL DATA:  Posterior chest pain and shortness of breath for 5 hours. Nausea. EXAM: CHEST - 2 VIEW COMPARISON:  10/25/2011 FINDINGS: The heart size and mediastinal contours are within normal limits. Both lungs are clear. The visualized skeletal structures are unremarkable. IMPRESSION: No active cardiopulmonary disease. Electronically Signed   By: Marlaine Hind M.D.   On: 05/02/2021 10:34   CT Angio Chest/Abd/Pel for Dissection W and/or  W/WO  Result Date: 05/02/2021 CLINICAL DATA:  Patient with pain between the scapulas in the lumbar area. EXAM: CT ANGIOGRAPHY CHEST, ABDOMEN AND PELVIS TECHNIQUE: Chest radiograph earlier same day. Multidetector CT imaging through the chest, abdomen and pelvis was performed using the standard protocol during bolus administration of intravenous contrast. Multiplanar reconstructed images and MIPs were obtained and reviewed to evaluate the vascular anatomy. CONTRAST:  140mL OMNIPAQUE IOHEXOL 350 MG/ML SOLN COMPARISON:  None. FINDINGS: CTA CHEST FINDINGS Cardiovascular: Normal heart size. Trace fluid superior pericardial recess. Thoracic aortic vascular calcifications. Motion artifact limits evaluation of the aortic root. Noncontrast images through the chest demonstrate no  high density within the thoracic aorta is to suggest intramural hematoma. Origin of the great vessels are patent. No evidence for thoracic aortic dissection. Mediastinum/Nodes: No enlarged axillary, mediastinal or hilar lymphadenopathy. Normal appearance of the esophagus. Lungs/Pleura: Central airways are patent. No large area pulmonary consolidation. No pleural effusion or pneumothorax. Musculoskeletal: Thoracic spine degenerative changes. No aggressive or acute appearing osseous lesions. Review of the MIP images confirms the above findings. CTA ABDOMEN AND PELVIS FINDINGS VASCULAR Aorta: Normal caliber aorta without aneurysm, dissection, vasculitis or significant stenosis. Celiac: Patent without evidence of aneurysm, dissection, vasculitis or significant stenosis. SMA: Patent without evidence of aneurysm, dissection, vasculitis or significant stenosis. Renals: Both renal arteries are patent without evidence of aneurysm, dissection, vasculitis, fibromuscular dysplasia or significant stenosis. IMA: Patent without evidence of aneurysm, dissection, vasculitis or significant stenosis. Inflow: Patent without evidence of aneurysm, dissection, vasculitis  or significant stenosis. Veins: No obvious venous abnormality within the limitations of this arterial phase study. Review of the MIP images confirms the above findings. NON-VASCULAR Hepatobiliary: Liver is normal in size and contour. Gallbladder is unremarkable. Subcentimeter too small to characterize low-attenuation lesion left hepatic lobe (image 81; series 5). No intrahepatic or extrahepatic biliary ductal dilatation. Pancreas: Unremarkable Spleen: Unremarkable Adrenals/Urinary Tract: Normal adrenal glands. Kidneys enhance symmetrically with contrast. There 1.5 cm exophytic cyst mid pole left kidney. Urinary bladder is unremarkable. Stomach/Bowel: No abnormal bowel wall thickening or evidence for bowel obstruction. No free fluid or free intraperitoneal air. Normal morphology of the stomach. Lymphatic: No retroperitoneal lymphadenopathy. Reproductive: Heterogeneous prostate. Other: Small fat containing left inguinal hernia. Musculoskeletal: Lumbar spine degenerative changes. No aggressive or acute appearing osseous lesions. Review of the MIP images confirms the above findings. IMPRESSION: No CT evidence for thoracic or abdominal aortic dissection. No acute process identified within the chest, abdomen or pelvis. Electronically Signed   By: Lovey Newcomer M.D.   On: 05/02/2021 13:20   US Abdomen Limited RUQ (LIVER/GB)  Result Date: 05/02/2021 CLINICAL DATA:  Epigastric pain. EXAM: ULTRASOUND ABDOMEN LIMITED RIGHT UPPER QUADRANT COMPARISON:  CT from the same day. FINDINGS: Gallbladder: Multiple layering gallstones. No definite gallbladder wall thickening visualized. The gallbladder wall measures 2.9 mm, upper limits of normal. No sonographic Murphy sign noted by sonographer. Common bile duct: Diameter: 2.7 mm Liver: No focal lesion identified. Within normal limits in parenchymal echogenicity. Portal vein is patent on color Doppler imaging with normal direction of blood flow towards the liver. Other: None.  IMPRESSION: Cholelithiasis without sonographic evidence of acute cholecystitis. Normal appearance of the liver. Electronically Signed   By: Fidela Salisbury M.D.   On: 05/02/2021 14:30    Labs on Admission: I have personally reviewed following labs  CBC: Recent Labs  Lab 05/02/21 0936  WBC 7.4  HGB 14.5  HCT 42.7  MCV 87.0  PLT 001   Basic Metabolic Panel: Recent Labs  Lab 05/02/21 0936  NA 138  K 4.3  CL 105  CO2 24  GLUCOSE 143*  BUN 22  CREATININE 0.95  CALCIUM 9.1   GFR: Estimated Creatinine Clearance: 100.8 mL/min (by C-G formula based on SCr of 0.95 mg/dL).  Liver Function Tests: Recent Labs  Lab 05/02/21 0936 05/02/21 1453  AST 260* 662*  ALT 179* 597*  ALKPHOS 42 48  BILITOT 2.0* 3.9*  PROT 7.2 7.2  ALBUMIN 4.5 4.3   Recent Labs  Lab 05/02/21 0936  LIPASE 34   Urine analysis:    Component Value Date/Time   COLORURINE YELLOW 12/10/2020 0818  APPEARANCEUR CLEAR 12/10/2020 0818   LABSPEC >=1.030 (A) 12/10/2020 0818   PHURINE 6.0 12/10/2020 0818   GLUCOSEU NEGATIVE 12/10/2020 0818   HGBUR NEGATIVE 12/10/2020 0818   BILIRUBINUR NEGATIVE 12/10/2020 0818   KETONESUR NEGATIVE 12/10/2020 0818   UROBILINOGEN 0.2 12/10/2020 0818   NITRITE NEGATIVE 12/10/2020 0818   LEUKOCYTESUR NEGATIVE 12/10/2020 0818   Starlin Steib N Edel Rivero D.O. [facility]  If 7PM-7AM, please contact overnight-coverage provider If 7AM-7PM, please contact day coverage provider [email or web address]  05/02/2021, 4:04 PM

## 2021-05-02 NOTE — ED Provider Notes (Signed)
[facility] Emergency Department Provider Note   ____________________________________________   None    (approximate)  I have reviewed the triage vital signs and the nursing notes.   HISTORY  Chief Complaint Chest Pain    HPI Joe Williams is a 65 y.o. male with past medical history of sinus bradycardia and hyperlipidemia who presents to the ED complaining of abdominal and chest pain.  Patient reports that earlier this morning he developed pain in his epigastrium which she describes as sharp and constant.  It seems to have moved up into the center of his chest along with the middle of his back between his shoulder blades.  Pain is not exacerbated or alleviated by anything, is associated with nausea and multiple episodes of vomiting.  He has not had any fevers, cough, shortness of breath, or flank pain.  He denies any history of similar symptoms, has never had surgery on his abdomen before.        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   Urinary urgency 01/12/2016   Anterior chest wall pain 01/06/2015   Right knee pain 01/06/2015   Fever, unspecified 05/20/2013   Headache(784.0) 05/20/2013   Low testosterone 12/14/2012   Abnormal EKG 12/14/2012   Well adult exam 12/14/2011   Hyperglycemia 12/14/2011   Erectile dysfunction 12/14/2011   Cough 10/25/2011   PLANTAR FASCIITIS 10/13/2009   TOBACCO USE, QUIT 10/13/2009   COLONIC POLYPS, HX OF 09/01/2008   GLUCOSE INTOLERANCE 07/06/2007   Dyslipidemia  07/06/2007   DYSFUNCTION, SINOATRIAL NODE 07/06/2007   GERD 07/06/2007    Past Surgical History:  Procedure Laterality Date   COLONOSCOPY     [place] (approx)   right -Dr. Mayer Camel    Prior to Admission medications   Medication Sig Start Date End Date Taking? Authorizing Provider  aspirin EC 81 MG tablet Take 81 mg by mouth daily.    [provider]  cholecalciferol (VITAMIN D) 1000 UNITS tablet Take 1,000 Units by mouth daily.    [provider]  melatonin 5 MG TABS Take 5 mg by mouth.    [provider]  meloxicam (MOBIC) 15 MG tablet TAKE 1 TABLET BY MOUTH EVERY DAY AS NEEDED FOR PAIN 03/16/21   Plotnikov, Evie Lacks, MD  omeprazole (PRILOSEC) 40 MG capsule Take 1 capsule (40 mg total) by mouth daily. 03/01/21   Plotnikov, Evie Lacks, MD  POTASSIUM GLUCONATE PO Take 1 tablet by mouth daily.    [provider]  pravastatin (PRAVACHOL) 80 MG tablet TAKE 1 TABLET BY MOUTH EVERY EVENING 03/16/21   Plotnikov, Evie Lacks, MD  Saw Palmetto 160 MG TABS Take 1 tablet by mouth daily.    [provider]  tamsulosin (FLOMAX) 0.4 MG CAPS capsule TAKE 1 CAPSULE BY MOUTH EVERY DAY 02/15/21   Plotnikov, Evie Lacks, MD  vitamin C (ASCORBIC ACID) 500 MG tablet Take 500 mg by mouth daily.  [provider]    Allergies Lovastatin and Tetracycline  Family History  Problem Relation Age of Onset   Gallbladder disease Mother    Heart disease Mother        MI, irreg rhthm on coumadin   Ulcers Father    Ulcers Sister    Dementia Other        Multiple with dementia but lived to old age   Colon cancer Neg Hx     Social History Social History   Tobacco Use   Smoking status: Former    Pack years: 0.00   Smokeless tobacco: Never  Substance Use Topics   Alcohol use: Yes    Alcohol/week: 3.0 standard drinks    Types: 3 Cans of beer per week    Comment: wknd   Drug use: No    Review of Systems  Constitutional: No  fever/chills Eyes: No visual changes. ENT: No sore throat. Cardiovascular: Positive for chest pain. Respiratory: Denies shortness of breath. Gastrointestinal: Positive for abdominal pain, nausea, and vomiting.  No diarrhea.  No constipation. Genitourinary: Negative for dysuria. Musculoskeletal: Negative for back pain. Skin: Negative for rash. Neurological: Negative for headaches, focal weakness or numbness.  ____________________________________________   PHYSICAL EXAM:  VITAL SIGNS: ED Triage Vitals  Enc Vitals Group     BP 05/02/21 0932 (!) 146/81     Pulse Rate 05/02/21 0932 64     Resp 05/02/21 0932 18     Temp 05/02/21 0932 97.6 F (36.4 C)     Temp Source 05/02/21 0932 Oral     SpO2 05/02/21 0932 99 %     Weight 05/02/21 0931 250 lb (113.4 kg)     Height 05/02/21 0931 6' (1.829 m)     Head Circumference --      Peak Flow --      Pain Score 05/02/21 0930 10     Pain Loc --      Pain Edu? --      Excl. in [place]? --     Constitutional: Alert and oriented. Eyes: Conjunctivae are normal. Head: Atraumatic. Nose: No congestion/rhinnorhea. Mouth/Throat: Mucous membranes are moist. Neck: Normal ROM Cardiovascular: Normal rate, regular rhythm. Grossly normal heart sounds.  2+ radial pulses bilaterally. Respiratory: Normal respiratory effort.  No retractions. Lungs CTAB. Gastrointestinal: Soft and tender to palpation in the epigastrium with no rebound or guarding. No distention. Genitourinary: deferred Musculoskeletal: No lower extremity tenderness nor edema. Neurologic:  Normal speech and language. No gross focal neurologic deficits are appreciated. Skin:  Skin is warm, dry and intact. No rash noted. Psychiatric: Mood and affect are normal. Speech and behavior are normal.  ____________________________________________   LABS (all labs ordered are listed, but only abnormal results are displayed)  Labs Reviewed  BASIC METABOLIC PANEL - Abnormal; Notable for the following  components:      Result Value   Glucose, Bld 143 (*)    All other components within normal limits  HEPATIC FUNCTION PANEL - Abnormal; Notable for the following components:   AST 260 (*)    ALT 179 (*)    Total Bilirubin 2.0 (*)    Bilirubin, Direct 0.9 (*)    Indirect Bilirubin 1.1 (*)    All other components within normal limits  HEPATIC FUNCTION PANEL - Abnormal; Notable for the following components:   AST 662 (*)    ALT 597 (*)    Total Bilirubin 3.9 (*)    Bilirubin, Direct 2.0 (*)    Indirect Bilirubin  1.9 (*)    All other components within normal limits  SARS CORONAVIRUS 2 (TAT 6-24 HRS)  CBC  LIPASE, BLOOD  HEPATITIS PANEL, ACUTE  TROPONIN I (HIGH SENSITIVITY)  TROPONIN I (HIGH SENSITIVITY)   ____________________________________________  EKG  ED ECG REPORT I, Blake Divine, the attending physician, personally viewed and interpreted this ECG.   Date: 05/02/2021  EKG Time: 9:32  Rate: 66  Rhythm: normal EKG, normal sinus rhythm, unchanged from previous tracings  Axis: Normal  Intervals:none  ST&T Change: None   PROCEDURES  Procedure(s) performed (including Critical Care):  Procedures   ____________________________________________   INITIAL IMPRESSION / ASSESSMENT AND PLAN / ED COURSE      65 year old male with past medical history of hyperlipidemia and sinus bradycardia who presents to the ED complaining of pain in his epigastrium moving up into his chest starting earlier this morning along with radiation to the area between his shoulder blades.  Pain is reproducible with palpation of his epigastrium as well as the center of his chest.  EKG shows no evidence of arrhythmia or ischemia and initial troponin is negative.  Low suspicion for ACS overall given his atypical symptoms but we will screen repeat troponin.  CBC and BMP are unremarkable, we will add on LFTs and lipase.  Given description of symptoms, we will also further assess with CTA to rule out  aortic dissection or other aortic pathology.  CTA is negative for aortic pathology or other acute process.  Lipase within normal limits, however LFTs noted to be elevated.  Right upper quadrant ultrasound was performed and shows cholelithiasis without evidence of cholecystitis, low suspicion for choledocholithiasis given CBD within normal limits and alkaline phosphatase also within normal limits.  Findings discussed with Dr. Marius Ditch of GI, who recommends repeating LFTs and if they are stable patient will be appropriate for discharge home with outpatient general surgery follow-up.  Repeat LFTs show marked elevation in transaminases as well as rising bilirubin, which is both direct and indirect.  Hepatitis seems to be the most likely cause of his symptoms at this time and plan to discuss with hospitalist for admission.      ____________________________________________   FINAL CLINICAL IMPRESSION(S) / ED DIAGNOSES  Final diagnoses:  Epigastric pain  Hepatitis     ED Discharge Orders     None        Note:  This document was prepared using Dragon voice recognition software and may include unintentional dictation errors.    Blake Divine, MD 05/02/21 [id number]

## 2021-05-02 NOTE — ED Notes (Signed)
Request made for transport  

## 2021-05-03 ENCOUNTER — Observation Stay: Payer: Medicare Other

## 2021-05-03 DIAGNOSIS — R1013 Epigastric pain: Secondary | ICD-10-CM | POA: Diagnosis not present

## 2021-05-03 LAB — CBC
HCT: 43.3 % (ref 39.0–52.0)
Hemoglobin: 15 g/dL (ref 13.0–17.0)
MCH: 29.8 pg (ref 26.0–34.0)
MCHC: 34.6 g/dL (ref 30.0–36.0)
MCV: 85.9 fL (ref 80.0–100.0)
Platelets: 194 10*3/uL (ref 150–400)
RBC: 5.04 MIL/uL (ref 4.22–5.81)
RDW: 13.4 % (ref 11.5–15.5)
WBC: 6.4 10*3/uL (ref 4.0–10.5)
nRBC: 0 % (ref 0.0–0.2)

## 2021-05-03 LAB — TSH: TSH: 0.456 u[IU]/mL (ref 0.350–4.500)

## 2021-05-03 LAB — PROTIME-INR
INR: 1.2 (ref 0.8–1.2)
Prothrombin Time: 14.8 seconds (ref 11.4–15.2)

## 2021-05-03 LAB — HEPATIC FUNCTION PANEL
ALT: 574 U/L — ABNORMAL HIGH (ref 0–44)
AST: 324 U/L — ABNORMAL HIGH (ref 15–41)
Albumin: 4.3 g/dL (ref 3.5–5.0)
Alkaline Phosphatase: 69 U/L (ref 38–126)
Bilirubin, Direct: 4.1 mg/dL — ABNORMAL HIGH (ref 0.0–0.2)
Indirect Bilirubin: 3 mg/dL — ABNORMAL HIGH (ref 0.3–0.9)
Total Bilirubin: 7.1 mg/dL — ABNORMAL HIGH (ref 0.3–1.2)
Total Protein: 7.2 g/dL (ref 6.5–8.1)

## 2021-05-03 LAB — BASIC METABOLIC PANEL
Anion gap: 9 (ref 5–15)
BUN: 20 mg/dL (ref 8–23)
CO2: 28 mmol/L (ref 22–32)
Calcium: 9.2 mg/dL (ref 8.9–10.3)
Chloride: 100 mmol/L (ref 98–111)
Creatinine, Ser: 0.87 mg/dL (ref 0.61–1.24)
GFR, Estimated: >60 mL/min (ref >60)
Glucose, Bld: 129 mg/dL — ABNORMAL HIGH (ref 70–99)
Potassium: 4.3 mmol/L (ref 3.5–5.1)
Sodium: 137 mmol/L (ref 135–145)

## 2021-05-03 LAB — SARS CORONAVIRUS 2 (TAT 6-24 HRS): SARS Coronavirus 2: NEGATIVE

## 2021-05-03 LAB — APTT: aPTT: 27 seconds (ref 24–36)

## 2021-05-03 MED ORDER — DIPHENHYDRAMINE HCL 50 MG/ML IJ SOLN
25.0000 mg | Freq: Once | INTRAMUSCULAR | Status: AC | PRN
  Administered 2021-05-03: 25 mg via INTRAVENOUS
  Filled 2021-05-03: qty 1

## 2021-05-03 MED ORDER — GADOBUTROL 1 MMOL/ML IV SOLN
9.0000 mL | Freq: Once | INTRAVENOUS | Status: AC | PRN
  Administered 2021-05-03: 9 mL via INTRAVENOUS

## 2021-05-03 MED ORDER — SODIUM CHLORIDE 0.9 % IV SOLN: INTRAVENOUS | Status: DC

## 2021-05-03 MED ORDER — ONDANSETRON HCL 4 MG PO TABS: 4.0000 mg | ORAL_TABLET | Freq: Four times a day (QID) | ORAL | 0 refills | Status: DC | PRN
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
## 2021-05-04 ENCOUNTER — Telehealth: Payer: Self-pay | Admitting: Internal Medicine

## 2021-05-04 DIAGNOSIS — E785 Hyperlipidemia, unspecified: Secondary | ICD-10-CM

## 2021-05-04 NOTE — Telephone Encounter (Signed)
Patient called and said that when at the hospital they told him to requesting a complete metabolic panel with repeat liver function tests. Please advise   Phone: [phone]

## 2021-05-05 LAB — ANTI-MICROSOMAL ANTIBODY LIVER / KIDNEY: LKM1 Ab: 0.7 Units (ref 0.0–20.0)

## 2021-05-05 LAB — ANTI-SMOOTH MUSCLE ANTIBODY, IGG: F-Actin IgG: 2 Units (ref 0–19)

## 2021-05-05 NOTE — Telephone Encounter (Signed)
Hosp f/u has been made fr 05/12/21.Marland KitchenJohny Williams

## 2021-05-06 ENCOUNTER — Ambulatory Visit: Payer: Medicare Other | Admitting: Family

## 2021-05-06 ENCOUNTER — Other Ambulatory Visit: Payer: Self-pay

## 2021-05-06 ENCOUNTER — Encounter: Payer: Self-pay | Admitting: Family

## 2021-05-06 VITALS — BP 110/70 | HR 62 | Ht 72.0 in | Wt 245.0 lb

## 2021-05-06 DIAGNOSIS — R7401 Elevation of levels of liver transaminase levels: Secondary | ICD-10-CM | POA: Diagnosis not present

## 2021-05-06 DIAGNOSIS — E785 Hyperlipidemia, unspecified: Secondary | ICD-10-CM

## 2021-05-06 DIAGNOSIS — I25118 Atherosclerotic heart disease of native coronary artery with other forms of angina pectoris: Secondary | ICD-10-CM

## 2021-05-06 DIAGNOSIS — R002 Palpitations: Secondary | ICD-10-CM

## 2021-05-06 DIAGNOSIS — E782 Mixed hyperlipidemia: Secondary | ICD-10-CM | POA: Diagnosis not present

## 2021-05-06 NOTE — Progress Notes (Signed)
Office Visit    Patient Name: Joe Williams Date of Encounter: 05/06/2021  PCP:  Cassandria Anger, MD   [place]  Cardiologist:  Fransico Him, MD  Advanced Practice Provider:  No care team member to display Electrophysiologist:  None   Chief Complaint    Joe Williams is a 65 y.o. male with a hx of hyperlipidemia, tobacco use, glucose intolerance, coronary artery disease presents today for hospital follow-up  Past Medical History    Past Medical History:  Diagnosis Date   COLONIC POLYPS, HX OF    rectal-hyperplastic only 2007   DYSFUNCTION, SINOATRIAL NODE    chronic sinus brady   GERD    GLUCOSE INTOLERANCE    History of shingles    HYPERLIPIDEMIA    Morbid obesity ([place])    PLANTAR FASCIITIS    TOBACCO USE, QUIT    Past Surgical History:  Procedure Laterality Date   COLONOSCOPY     ROTATOR CUFF REPAIR  1995 (approx)   right -Dr. Mayer Camel    Allergies  Allergies  Allergen Reactions   Lovastatin    Tetracycline     History of Present Illness    Joe Williams is a 65 y.o. male with a hx of hyperlipidemia, tobacco use, glucose intolerance, coronary artery disease, recommend spotted fever 2000 and 2005, BPH, GERD last seen 08/31/2020 by Dr. Radford Pax.  He was evaluated by Dr. Radford Pax initially due to family history of coronary artery disease.  His mom died of an MI in her mid 71s.  He underwent calcium scoring with coronary calcium score of 242.  He had Lexiscan Myoview 08/2019 with no evidence of ischemia.  He was seen in follow-up 08/31/2020 noting palpitations after receiving COVID-19 vaccination.  He was hospitalized 05/02/2021 - 05/03/2021 after presenting with chest pain and epigastric region radiating to the back between shoulder blades and abdomen.  It woke him up from sleep.  He had nausea and vomiting while in the emergency department.  He had elevated liver enzymes with acute hepatitis panel negative.  AST trended 260 ?662 ? 324.  ALT trended 179 ?597 ? 574.  High-sensitivity troponin unremarkable of 3 and 2.  Liver enzymes 12/10/20 were normal. Possible etiology of recently passed gallstone.  He was discharged and instructed to hold saw palmetto indefinitely and statin until repeat CMP in outpatient setting in 1 week.  He presents today for follow-up.  Tells me his discomfort that led him to go to the emergency department started below his epigastric region radiated down his rib cage and through to his back.  Reports no chest pain, nausea, tightness.  Ports no shortness of breath at rest or dyspnea on exertion.  Reports no lightheadedness, dizziness, ischemia, syncope.  Denies palpitations.  Tells me he stays active working in the yard.  Endorses following a low-salt, heart healthy diet.  We reviewed his liver enzymes during the hospital stay.  EKGs/Labs/Other Studies Reviewed:   The following studies were reviewed today  CT angio chest/abdomen/pelvis  Cardiovascular: Normal heart size. Trace fluid superior pericardial recess. Thoracic aortic vascular calcifications. Motion artifact limits evaluation of the aortic root. Noncontrast images through the chest demonstrate no high density within the thoracic aorta is to suggest intramural hematoma. Origin of the great vessels are patent. No evidence for thoracic aortic dissection.   Mediastinum/Nodes: No enlarged axillary, mediastinal or hilar lymphadenopathy. Normal appearance of the esophagus.   Lungs/Pleura: Central airways are patent. No large area pulmonary consolidation.  No pleural effusion or pneumothorax.   Musculoskeletal: Thoracic spine degenerative changes. No aggressive or acute appearing osseous lesions.   Review of the MIP images confirms the above findings.   CTA ABDOMEN AND PELVIS FINDINGS   VASCULAR   Aorta: Normal caliber aorta without aneurysm, dissection, vasculitis or significant stenosis.   Celiac: Patent without evidence of aneurysm,  dissection, vasculitis or significant stenosis.   SMA: Patent without evidence of aneurysm, dissection, vasculitis or significant stenosis.   Renals: Both renal arteries are patent without evidence of aneurysm, dissection, vasculitis, fibromuscular dysplasia or significant stenosis.   IMA: Patent without evidence of aneurysm, dissection, vasculitis or significant stenosis.   Inflow: Patent without evidence of aneurysm, dissection, vasculitis or significant stenosis.   Veins: No obvious venous abnormality within the limitations of this arterial phase study.   Review of the MIP images confirms the above findings.   NON-VASCULAR   Hepatobiliary: Liver is normal in size and contour. Gallbladder is unremarkable. Subcentimeter too small to characterize low-attenuation lesion left hepatic lobe (image 81; series 5). No intrahepatic or extrahepatic biliary ductal dilatation.   Pancreas: Unremarkable   Spleen: Unremarkable   Adrenals/Urinary Tract: Normal adrenal glands. Kidneys enhance symmetrically with contrast. There 1.5 cm exophytic cyst mid pole left kidney. Urinary bladder is unremarkable.   Stomach/Bowel: No abnormal bowel wall thickening or evidence for bowel obstruction. No free fluid or free intraperitoneal air. Normal morphology of the stomach.   Lymphatic: No retroperitoneal lymphadenopathy.   Reproductive: Heterogeneous prostate.   Other: Small fat containing left inguinal hernia.   Musculoskeletal: Lumbar spine degenerative changes. No aggressive or acute appearing osseous lesions.   Review of the MIP images confirms the above findings.   IMPRESSION: No CT evidence for thoracic or abdominal aortic dissection.   No acute process identified within the chest, abdomen or pelvis.   Coronary calcium score 06/13/2019 IMPRESSION: Coronary calcium score of 242. This was 75th percentile for age and sex matched control.  Lexiscan Myoview 08/2019 Nuclear stress  EF: 64%. There was no ST segment deviation noted during stress. The study is normal. This is a low risk study. The left ventricular ejection fraction is normal (55-65%).   There were reduced counts in the inferior wall on rest imaging in the upright position that improved on stress imaging (especially on supine imaging) consistent with diaphragm attenuation.  This is a normal myocardial perfusion imaging study without evidence of ischemia.  This is a low risk study.  EKG:  EKG independently reviewed from 05/02/21 shows NSR 66 bpm with no acute ST/T wave changes.    Recent Labs: 05/03/2021: ALT 574; BUN 20; Creatinine, Ser 0.87; Hemoglobin 15.0; Platelets 194; Potassium 4.3; Sodium 137; TSH 0.456  Recent Lipid Panel    Component Value Date/Time   CHOL 136 09/08/2020 0756   TRIG 73 09/08/2020 0756   HDL 48 09/08/2020 0756   CHOLHDL 2.8 09/08/2020 0756   CHOLHDL 3 02/03/2020 0802   VLDL 20.2 02/03/2020 0802   LDLCALC 73 09/08/2020 0756    Home Medications   Current Meds  Medication Sig   aspirin EC 81 MG tablet Take 81 mg by mouth daily.   Aspirin-Caffeine (BAYER BACK & BODY PO) Take 1 tablet by mouth daily as needed.   cetirizine (ZYRTEC) 10 MG tablet Take 10 mg by mouth at bedtime as needed for allergies.   cholecalciferol (VITAMIN D) 1000 UNITS tablet Take 1,000 Units by mouth daily.   melatonin 5 MG TABS Take 10 mg by mouth  at bedtime.   meloxicam (MOBIC) 15 MG tablet TAKE 1 TABLET BY MOUTH EVERY DAY AS NEEDED FOR PAIN   Menthol, Topical Analgesic, 4 % GEL Apply 1 application topically as needed. To knees   omeprazole (PRILOSEC) 40 MG capsule Take 1 capsule (40 mg total) by mouth daily.   ondansetron (ZOFRAN) 4 MG tablet Take 1 tablet (4 mg total) by mouth every 6 (six) hours as needed for nausea.   potassium gluconate 595 (99 K) MG TABS tablet Take 1 tablet by mouth daily.   tamsulosin (FLOMAX) 0.4 MG CAPS capsule TAKE 1 CAPSULE BY MOUTH EVERY DAY   vitamin C (ASCORBIC ACID)  500 MG tablet Take 500 mg by mouth daily.     Review of Systems   All other systems reviewed and are otherwise negative except as noted above.  Physical Exam    VS:  BP 110/70 (BP Location: Left Arm, Patient Position: Sitting, Cuff Size: Normal)   Pulse 62   Ht 6' (1.829 m)   Wt 245 lb (111.1 kg)   SpO2 96%   BMI 33.23 kg/m  , BMI Body mass index is 33.23 kg/m.  Wt Readings from Last 3 Encounters:  05/06/21 245 lb (111.1 kg)  05/03/21 175 lb 14.8 oz (79.8 kg)  12/22/20 259 lb 3.2 oz (117.6 kg)    GEN: Well nourished, well developed, in no acute distress. HEENT: normal. Neck: Supple, no JVD, carotid bruits, or masses. Cardiac: RRR, no murmurs, rubs, or gallops. No clubbing, cyanosis, edema.  Radials/PT 2+ and equal bilaterally.  Respiratory:  Respirations regular and unlabored, clear to auscultation bilaterally. GI: Soft, nontender, nondistended. MS: No deformity or atrophy. Skin: Warm and dry, no rash. Neuro:  Strength and sensation are intact. Psych: Normal affect.  Assessment & Plan    CAD- Stable with no anginal symptoms. No indication for ischemic evaluation. EKG 05/02/21 NSR no acute ST/T wave changes. GDMT includes aspirin. Statin on home due to transaminitis, details below. No beta blocker due to low normal BP/HR. Heart healthy diet and regular cardiovascular exercise encouraged.   HLD, LDL goal less than 70- 09/08/20 total cholesterol 135, HDL 48, triglycerides 73, LDL 73. Previously Pravastatin 40mg  daily which was increased to Pravastatin 80mg  daily 08/2019. Low suspicion recent transaminitis was related to statin usage as he had normal liver enzymes 11/2020 and has been on pravastatin 80 mg for 1.5 years. Plan for repeat labs with PCP 05/12/21 to reassess liver function.  If liver enzymes normalized consider starting reduced dose statin-LDL goal less than 70 in the setting of coronary artery disease-consider Pravastatin 40 mg daily versus Crestor 10 mg daily with repeat  lipid panel in 6-8 weeks. If liver enzymes not yet normalized, plan for repeat monitoring and consider utilization of Zetia 10mg  daily.   Transaminitis- Liver enzymes markedly elevated during recent admission.  Follow-up with primary care provider.  Continue to hold saw palmetto indefinitely and hold statin until repeat lab work with primary care 05/12/21.  Palpitations- No recurrence.  No indication for further work-up.   Disposition: Follow up in 4 months with Dr. Radford Pax or APP  Signed, Loel Dubonnet, NP 05/06/2021, 8:23 AM [place]

## 2021-05-06 NOTE — Patient Instructions (Addendum)
Medication Instructions:  Continue your current medications.   Remain off of your cholesterol medications until your lab work is checked next week with your primary care provider.   *If you need a refill on your cardiac medications before your next appointment, please call your pharmacy*   Lab Work: None ordered today   Testing/Procedures: None ordered today.  Your EKG in the emergency department showed normal sinus rhythm which is a good result!   Follow-Up: At [facility], you and your health needs are our priority.  As part of our continuing mission to provide you with exceptional heart care, we have created designated Provider Care Teams.  These Care Teams include your primary Cardiologist (physician) and Advanced Practice Providers (APPs -  Physician Assistants and Nurse Practitioners) who all work together to provide you with the care you need, when you need it.  We recommend signing up for the patient portal called "MyChart".  Sign up information is provided on this After Visit Summary.  MyChart is used to connect with patients for Virtual Visits (Telemedicine).  Patients are able to view lab/test results, encounter notes, upcoming appointments, etc.  Non-urgent messages can be sent to your provider as well.   To learn more about what you can do with MyChart, go to [email or web address].    Your next appointment:   4 month(s) 09/08/2021 at 8:00 AM  The format for your next appointment:   In Person  Provider:   You may see Fransico Him, MD or one of the following Advanced Practice Providers on your designated Care Team:   Melina Copa, PA-C Ermalinda Barrios, PA-C   Other Instructions  Heart Healthy Diet Recommendations: A low-salt diet is recommended. Meats should be grilled, baked, or boiled. Avoid fried foods. Focus on lean protein sources like fish or chicken with vegetables and fruits. The American Heart Association is a Microbiologist!  American Heart Association  Diet and Lifeystyle Recommendations   Exercise recommendations: The American Heart Association recommends 150 minutes of moderate intensity exercise weekly. Try 30 minutes of moderate intensity exercise 4-5 times per week. This could include walking, jogging, or swimming.

## 2021-05-06 NOTE — Telephone Encounter (Signed)
Called pt spoke w/wife inform her order has been entered in epic, and husband can go to the [facility] to have labs done.Marland KitchenJohny Williams

## 2021-05-06 NOTE — Telephone Encounter (Signed)
Noted.  Please do a CMet prior.  Ordered.  Thank you

## 2021-05-10 ENCOUNTER — Other Ambulatory Visit (INDEPENDENT_AMBULATORY_CARE_PROVIDER_SITE_OTHER): Payer: Medicare Other

## 2021-05-10 DIAGNOSIS — E785 Hyperlipidemia, unspecified: Secondary | ICD-10-CM | POA: Diagnosis not present

## 2021-05-10 LAB — COMPREHENSIVE METABOLIC PANEL
ALT: 105 U/L — ABNORMAL HIGH (ref 0–53)
AST: 34 U/L (ref 0–37)
Albumin: 4.5 g/dL (ref 3.5–5.2)
Alkaline Phosphatase: 61 U/L (ref 39–117)
BUN: 20 mg/dL (ref 6–23)
CO2: 26 mEq/L (ref 19–32)
Calcium: 9.4 mg/dL (ref 8.4–10.5)
Chloride: 103 mEq/L (ref 96–112)
Creatinine, Ser: 0.98 mg/dL (ref 0.40–1.50)
GFR: 80.75 mL/min (ref >60.00)
Glucose, Bld: 102 mg/dL — ABNORMAL HIGH (ref 70–99)
Potassium: 4.4 mEq/L (ref 3.5–5.1)
Sodium: 137 mEq/L (ref 135–145)
Total Bilirubin: 1.1 mg/dL (ref 0.2–1.2)
Total Protein: 7.1 g/dL (ref 6.0–8.3)

## 2021-05-10 LAB — ANTINUCLEAR ANTIBODIES, IFA: ANA Ab, IFA: NEGATIVE

## 2021-05-12 ENCOUNTER — Encounter: Payer: Self-pay | Admitting: Internal Medicine

## 2021-05-12 ENCOUNTER — Ambulatory Visit: Payer: Medicare Other | Admitting: Internal Medicine

## 2021-05-12 ENCOUNTER — Other Ambulatory Visit: Payer: Self-pay

## 2021-05-12 DIAGNOSIS — K8021 Calculus of gallbladder without cholecystitis with obstruction: Secondary | ICD-10-CM | POA: Diagnosis not present

## 2021-05-12 DIAGNOSIS — B179 Acute viral hepatitis, unspecified: Secondary | ICD-10-CM | POA: Diagnosis not present

## 2021-05-12 DIAGNOSIS — K8020 Calculus of gallbladder without cholecystitis without obstruction: Secondary | ICD-10-CM | POA: Insufficient documentation

## 2021-05-12 DIAGNOSIS — I2583 Coronary atherosclerosis due to lipid rich plaque: Secondary | ICD-10-CM

## 2021-05-12 DIAGNOSIS — I2510 Atherosclerotic heart disease of native coronary artery without angina pectoris: Secondary | ICD-10-CM

## 2021-05-12 DIAGNOSIS — K219 Gastro-esophageal reflux disease without esophagitis: Secondary | ICD-10-CM

## 2021-05-12 DIAGNOSIS — E785 Hyperlipidemia, unspecified: Secondary | ICD-10-CM

## 2021-05-12 MED ORDER — TRAMADOL HCL 50 MG PO TABS: 25.0000 mg | ORAL_TABLET | Freq: Four times a day (QID) | ORAL | 0 refills | Status: AC | PRN

## 2021-05-12 NOTE — Progress Notes (Signed)
Subjective:  Patient ID: Joe Williams, male    DOB: 10/27/1956  Age: 65 y.o. MRN: [id number]  CC: Follow-up (ER follow-up- Want to discuss Meloxicam & Pravastatin)   HPI Joe Williams presents for post-hosp f/u - hepatitis C/o OA pain  Per d/c: Admit date: 05/02/2021 Discharge date: 05/03/2021   Admitted From: Home Disposition: Home   Recommendations for Outpatient Follow-up:  Follow up with PCP in 1-2 weeks Please obtain CMP with liver function test in 1 week   Home Health: No Equipment/Devices: None   Discharge Condition: Stable CODE STATUS: Full Diet recommendation: [name]   Brief/Interim Summary: 65 y.o. male with medical history significant for [facility] spotted fever in 2000 and 2005, hyperlipidemia, tobacco use, BPH, GERD, glucose intolerance, family history of CAD, presents to the emergency department for chief concerns of chest pain.   He describes the chest pain as in the epigastric region just inferior to the xiphoid process, radiating to back, between his shoulder blades, spreading to bilateral upper shoulders and bilateral abdominal sides.    He reports that the pain woke him up from sleep at approximately 5:15 AM, 10/10, sharp, lasting seconds, intermittent, worse with sitting down and laying down, and improved with standing up.   Currently, the pain is 2-3/10 in the epigastric region and the pain between his shoulder blade is 3-4/10.  [name] does not know what made the pain improve. He states he has never experienced pain like this before and he denies trauma to his person.   He has been working outside doing yard work cleaning storm damages in the sun on the following dates: 6/17 and 6/18.   He endorses nausea and vomiting at approximately at 10 AM and 12 after the x ray while in the emergency department   On the day of discharge had a lengthy discussion with the patient regarding his laboratory findings and the possible etiologies.  Differentials at  this time include toxic liver insult secondary to medication and/or food.  Patient states he ate a unusual tasting [place] sandwich on the day that his symptoms started.  I also advised him to discontinue his saw palmetto.  He agrees to do so.  Patient is also on high intensity statin therapy.  I recommend that he hold the statin until he sees his primary care physician within 1 week of discharge and gets repeat liver function test done.   At time of discharge patient's AST ALT and alkaline phosphatase all trending down.  Patient has no right upper quadrant tenderness.  Abdominal imaging is reassuring.  Interestingly patient has uptrending indirect bilirubin.  The etiology of this is unclear.  Could have been a recently passed gallstone.  No radiographic or clinical evidence of acute cholecystitis.  MRCP done, neg for CBD stone   Discharge Diagnoses:  Active Problems:   Abdominal pain   Abdominal pain Etiology unclear.  Lipase negative.  Troponin is negative.  Pain resolved at time of discharge.  Patient instructed to avoid acetaminophen containing pain medication.   Elevated liver function tests Etiology is unclear.  Acute hepatitis panel negative.  Acetaminophen, EtOH negative, UDS negative.  Autoimmune hepatitis panel in progress at time of discharge.  AST ALT are trending down at time of discharge but bilirubin remains elevated.  Etiology could be a recently passed gallstone.  No radiographic evidence of cholecystitis.  Patient can discharge home.  Instructed to stop saw palmetto indefinitely.  Instructed to hold his statin until repeat CMP done  at outpatient setting in a week of discharge."      Outpatient Medications Prior to Visit  Medication Sig Dispense Refill   aspirin EC 81 MG tablet Take 81 mg by mouth daily.     Aspirin-Caffeine (BAYER BACK & BODY PO) Take 1 tablet by mouth daily as needed.     cetirizine (ZYRTEC) 10 MG tablet Take 10 mg by mouth at bedtime as needed for allergies.      cholecalciferol (VITAMIN D) 1000 UNITS tablet Take 1,000 Units by mouth daily.     melatonin 5 MG TABS Take 10 mg by mouth at bedtime.     Menthol, Topical Analgesic, 4 % GEL Apply 1 application topically as needed. To knees     omeprazole (PRILOSEC) 40 MG capsule Take 1 capsule (40 mg total) by mouth daily. 90 capsule 3   ondansetron (ZOFRAN) 4 MG tablet Take 1 tablet (4 mg total) by mouth every 6 (six) hours as needed for nausea. 20 tablet 0   potassium gluconate 595 (99 K) MG TABS tablet Take 1 tablet by mouth daily.     tamsulosin (FLOMAX) 0.4 MG CAPS capsule TAKE 1 CAPSULE BY MOUTH EVERY DAY 90 capsule 3   vitamin C (ASCORBIC ACID) 500 MG tablet Take 500 mg by mouth daily.     meloxicam (MOBIC) 15 MG tablet TAKE 1 TABLET BY MOUTH EVERY DAY AS NEEDED FOR PAIN (Patient not taking: Reported on 05/12/2021) 90 tablet 0   No facility-administered medications prior to visit.    ROS: Review of Systems  Constitutional:  Negative for appetite change, fatigue and unexpected weight change.  HENT:  Negative for congestion, nosebleeds, sneezing, sore throat and trouble swallowing.   Eyes:  Negative for itching and visual disturbance.  Respiratory:  Negative for cough.   Cardiovascular:  Negative for chest pain, palpitations and leg swelling.  Gastrointestinal:  Negative for abdominal distention, blood in stool, diarrhea and nausea.  Genitourinary:  Negative for frequency and hematuria.  Musculoskeletal:  Negative for back pain, gait problem, joint swelling and neck pain.  Skin:  Negative for rash.  Neurological:  Negative for dizziness, tremors, speech difficulty and weakness.  Psychiatric/Behavioral:  Negative for agitation, dysphoric mood and sleep disturbance. The patient is not nervous/anxious.    Objective:  BP 112/70 (BP Location: Left Arm)   Pulse 69   Temp 98.8 F (37.1 C) (Oral)   Ht 6' (1.829 m)   Wt 239 lb 3.2 oz (108.5 kg)   SpO2 96%   BMI 32.44 kg/m   BP Readings from Last  3 Encounters:  05/12/21 112/70  05/06/21 110/70  05/03/21 122/71    Wt Readings from Last 3 Encounters:  05/12/21 239 lb 3.2 oz (108.5 kg)  05/06/21 245 lb (111.1 kg)  05/03/21 175 lb 14.8 oz (79.8 kg)    Physical Exam Constitutional:      General: He is not in acute distress.    Appearance: He is well-developed. He is obese.     Comments: NAD  Eyes:     Conjunctiva/sclera: Conjunctivae normal.     Pupils: Pupils are equal, round, and reactive to light.  Neck:     Thyroid: No thyromegaly.     Vascular: No JVD.  Cardiovascular:     Rate and Rhythm: Normal rate and regular rhythm.     Heart sounds: Normal heart sounds. No murmur heard.   No friction rub. No gallop.  Pulmonary:     Effort: Pulmonary effort is normal. No  respiratory distress.     Breath sounds: Normal breath sounds. No wheezing or rales.  Chest:     Chest wall: No tenderness.  Abdominal:     General: Bowel sounds are normal. There is no distension.     Palpations: Abdomen is soft. There is no mass.     Tenderness: There is no abdominal tenderness. There is no guarding or rebound.  Musculoskeletal:        General: No tenderness. Normal range of motion.     Cervical back: Normal range of motion.  Lymphadenopathy:     Cervical: No cervical adenopathy.  Skin:    General: Skin is warm and dry.     Findings: No rash.  Neurological:     Mental Status: He is alert and oriented to person, place, and time.     Cranial Nerves: No cranial nerve deficit.     Motor: No abnormal muscle tone.     Coordination: Coordination normal.     Gait: Gait normal.     Deep Tendon Reflexes: Reflexes are normal and symmetric.  Psychiatric:        Behavior: Behavior normal.        Thought Content: Thought content normal.        Judgment: Judgment normal.    Lab Results  Component Value Date   WBC 6.4 05/03/2021   HGB 15.0 05/03/2021   HCT 43.3 05/03/2021   PLT 194 05/03/2021   GLUCOSE 102 (H) 05/10/2021   CHOL 136  09/08/2020   TRIG 73 09/08/2020   HDL 48 09/08/2020   LDLCALC 73 09/08/2020   ALT 105 (H) 05/10/2021   AST 34 05/10/2021   NA 137 05/10/2021   K 4.4 05/10/2021   CL 103 05/10/2021   CREATININE 0.98 05/10/2021   BUN 20 05/10/2021   CO2 26 05/10/2021   TSH 0.456 05/03/2021   PSA 1.24 12/10/2020   INR 1.2 05/03/2021   HGBA1C 6.1 12/10/2020    DG Chest 2 View  Result Date: 05/02/2021 CLINICAL DATA:  Posterior chest pain and shortness of breath for 5 hours. Nausea. EXAM: CHEST - 2 VIEW COMPARISON:  10/25/2011 FINDINGS: The heart size and mediastinal contours are within normal limits. Both lungs are clear. The visualized skeletal structures are unremarkable. IMPRESSION: No active cardiopulmonary disease. Electronically Signed   By: Marlaine Hind M.D.   On: 05/02/2021 10:34   MR 3D Recon At Scanner  Result Date: 05/03/2021 CLINICAL DATA:  Cholelithiasis. Epigastric pain. Gallstones on ultrasound 1 day prior. EXAM: MRI ABDOMEN WITHOUT AND WITH CONTRAST (INCLUDING MRCP) TECHNIQUE: Multiplanar multisequence MR imaging of the abdomen was performed both before and after the administration of intravenous contrast. Heavily T2-weighted images of the biliary and pancreatic ducts were obtained, and three-dimensional MRCP images were rendered by post processing. CONTRAST:  4mL GADAVIST GADOBUTROL 1 MMOL/ML IV SOLN COMPARISON:  None. FINDINGS: Lower chest:  Lung bases are clear. Hepatobiliary: No intrahepatic biliary duct dilatation. Common bile duct is normal caliber. No filling defects in the common bile duct. There multiple faceted gallstones within the lumen the gallbladder. No gallbladder inflammation identified. Small benign cyst in the lateral LEFT hepatic lobe. Pancreas: Pancreatic duct is normal caliber. Normal pancreatic ductal anatomy. No pancreatic inflammation or fluid collections. Spleen: Normal spleen. Adrenals/urinary tract: Adrenal glands and kidneys are normal. Stomach/Bowel: Stomach and  limited of the small bowel is unremarkable Vascular/Lymphatic: Abdominal aortic normal caliber. No retroperitoneal periportal lymphadenopathy. Musculoskeletal: No aggressive osseous lesion IMPRESSION: 1. Cholelithiasis without evidence cholecystitis.  2. No choledocholithiasis.  Normal biliary tree. 3. Normal pancreas. Electronically Signed   By: Suzy Bouchard M.D.   On: 05/03/2021 14:34   MR ABDOMEN MRCP W WO CONTAST  Result Date: 05/03/2021 CLINICAL DATA:  Cholelithiasis. Epigastric pain. Gallstones on ultrasound 1 day prior. EXAM: MRI ABDOMEN WITHOUT AND WITH CONTRAST (INCLUDING MRCP) TECHNIQUE: Multiplanar multisequence MR imaging of the abdomen was performed both before and after the administration of intravenous contrast. Heavily T2-weighted images of the biliary and pancreatic ducts were obtained, and three-dimensional MRCP images were rendered by post processing. CONTRAST:  60mL GADAVIST GADOBUTROL 1 MMOL/ML IV SOLN COMPARISON:  None. FINDINGS: Lower chest:  Lung bases are clear. Hepatobiliary: No intrahepatic biliary duct dilatation. Common bile duct is normal caliber. No filling defects in the common bile duct. There multiple faceted gallstones within the lumen the gallbladder. No gallbladder inflammation identified. Small benign cyst in the lateral LEFT hepatic lobe. Pancreas: Pancreatic duct is normal caliber. Normal pancreatic ductal anatomy. No pancreatic inflammation or fluid collections. Spleen: Normal spleen. Adrenals/urinary tract: Adrenal glands and kidneys are normal. Stomach/Bowel: Stomach and limited of the small bowel is unremarkable Vascular/Lymphatic: Abdominal aortic normal caliber. No retroperitoneal periportal lymphadenopathy. Musculoskeletal: No aggressive osseous lesion IMPRESSION: 1. Cholelithiasis without evidence cholecystitis. 2. No choledocholithiasis.  Normal biliary tree. 3. Normal pancreas. Electronically Signed   By: Suzy Bouchard M.D.   On: 05/03/2021 14:34   CT  Angio Chest/Abd/Pel for Dissection W and/or W/WO  Result Date: 05/02/2021 CLINICAL DATA:  Patient with pain between the scapulas in the lumbar area. EXAM: CT ANGIOGRAPHY CHEST, ABDOMEN AND PELVIS TECHNIQUE: Chest radiograph earlier same day. Multidetector CT imaging through the chest, abdomen and pelvis was performed using the standard protocol during bolus administration of intravenous contrast. Multiplanar reconstructed images and MIPs were obtained and reviewed to evaluate the vascular anatomy. CONTRAST:  11mL OMNIPAQUE IOHEXOL 350 MG/ML SOLN COMPARISON:  None. FINDINGS: CTA CHEST FINDINGS Cardiovascular: Normal heart size. Trace fluid superior pericardial recess. Thoracic aortic vascular calcifications. Motion artifact limits evaluation of the aortic root. Noncontrast images through the chest demonstrate no high density within the thoracic aorta is to suggest intramural hematoma. Origin of the great vessels are patent. No evidence for thoracic aortic dissection. Mediastinum/Nodes: No enlarged axillary, mediastinal or hilar lymphadenopathy. Normal appearance of the esophagus. Lungs/Pleura: Central airways are patent. No large area pulmonary consolidation. No pleural effusion or pneumothorax. Musculoskeletal: Thoracic spine degenerative changes. No aggressive or acute appearing osseous lesions. Review of the MIP images confirms the above findings. CTA ABDOMEN AND PELVIS FINDINGS VASCULAR Aorta: Normal caliber aorta without aneurysm, dissection, vasculitis or significant stenosis. Celiac: Patent without evidence of aneurysm, dissection, vasculitis or significant stenosis. SMA: Patent without evidence of aneurysm, dissection, vasculitis or significant stenosis. Renals: Both renal arteries are patent without evidence of aneurysm, dissection, vasculitis, fibromuscular dysplasia or significant stenosis. IMA: Patent without evidence of aneurysm, dissection, vasculitis or significant stenosis. Inflow: Patent without  evidence of aneurysm, dissection, vasculitis or significant stenosis. Veins: No obvious venous abnormality within the limitations of this arterial phase study. Review of the MIP images confirms the above findings. NON-VASCULAR Hepatobiliary: Liver is normal in size and contour. Gallbladder is unremarkable. Subcentimeter too small to characterize low-attenuation lesion left hepatic lobe (image 81; series 5). No intrahepatic or extrahepatic biliary ductal dilatation. Pancreas: Unremarkable Spleen: Unremarkable Adrenals/Urinary Tract: Normal adrenal glands. Kidneys enhance symmetrically with contrast. There 1.5 cm exophytic cyst mid pole left kidney. Urinary bladder is unremarkable. Stomach/Bowel: No abnormal bowel wall thickening or evidence  for bowel obstruction. No free fluid or free intraperitoneal air. Normal morphology of the stomach. Lymphatic: No retroperitoneal lymphadenopathy. Reproductive: Heterogeneous prostate. Other: Small fat containing left inguinal hernia. Musculoskeletal: Lumbar spine degenerative changes. No aggressive or acute appearing osseous lesions. Review of the MIP images confirms the above findings. IMPRESSION: No CT evidence for thoracic or abdominal aortic dissection. No acute process identified within the chest, abdomen or pelvis. Electronically Signed   By: Lovey Newcomer M.D.   On: 05/02/2021 13:20   US Abdomen Limited RUQ (LIVER/GB)  Result Date: 05/02/2021 CLINICAL DATA:  Epigastric pain. EXAM: ULTRASOUND ABDOMEN LIMITED RIGHT UPPER QUADRANT COMPARISON:  CT from the same day. FINDINGS: Gallbladder: Multiple layering gallstones. No definite gallbladder wall thickening visualized. The gallbladder wall measures 2.9 mm, upper limits of normal. No sonographic Murphy sign noted by sonographer. Common bile duct: Diameter: 2.7 mm Liver: No focal lesion identified. Within normal limits in parenchymal echogenicity. Portal vein is patent on color Doppler imaging with normal direction of blood  flow towards the liver. Other: None. IMPRESSION: Cholelithiasis without sonographic evidence of acute cholecystitis. Normal appearance of the liver. Electronically Signed   By: Fidela Salisbury M.D.   On: 05/02/2021 14:30    Assessment & Plan:    Walker Kehr, MD

## 2021-05-12 NOTE — Assessment & Plan Note (Signed)
Cholelithiasis on ultrasound.  She has illness could be a biliary colic related to a passed gallstone.  His alkaline phosphatase was never too high, however. No alcohol, no Tylenol.  He will need a surgical consultation to address his cholelithiasis.  Repeat liver function tests in 2 months

## 2021-05-12 NOTE — Assessment & Plan Note (Signed)
Continue with omeprazole

## 2021-05-12 NOTE — Patient Instructions (Addendum)
Fish oil

## 2021-05-12 NOTE — Assessment & Plan Note (Signed)
Currently he is off pravastatin due to acute hepatitis (resolving).  He will use fish oil and aspirin for now Obtain lipids and liver tests in 6-8 weeks

## 2021-05-12 NOTE — Assessment & Plan Note (Signed)
Fish oil

## 2021-05-12 NOTE — Assessment & Plan Note (Addendum)
Etiology is unclear.  It could be statin related (Pravastatin).  Pravastatin is on hold.  Cholelithiasis on ultrasound.  She has illness could be a biliary colic related to a passed gallstone.  His alkaline phosphatase was never too high, however. No alcohol, no Tylenol.  He will need a surgical consultation to address his cholelithiasis.  Repeat liver function tests in 2 months

## 2021-06-12 ENCOUNTER — Other Ambulatory Visit: Payer: Self-pay | Admitting: Internal Medicine

## 2021-06-30 IMAGING — MR MRI LUMBAR SPINE WITHOUT CONTRAST
4 of 5 series · 25 of 48 positions shown · non-contrast
Comparison: None.

CLINICAL DATA: Severe low back pain over the last 6 weeks.
Right-sided weakness and numbness.

EXAM:
MRI LUMBAR SPINE WITHOUT CONTRAST
TECHNIQUE: Multiplanar, multisequence MR imaging of the lumbar spine was
performed. No intravenous contrast was administered.

[Series 2: T2 · sagittal · 4.0mm · 0.55mm/px · 6 of 16 slices shown (1 of 2)]
[im 1/16]
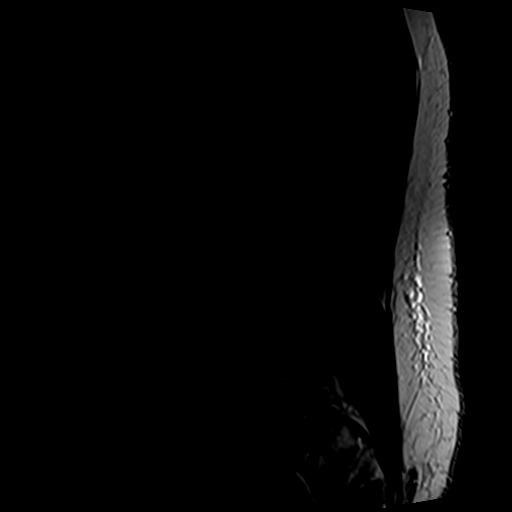
[im 4/16]
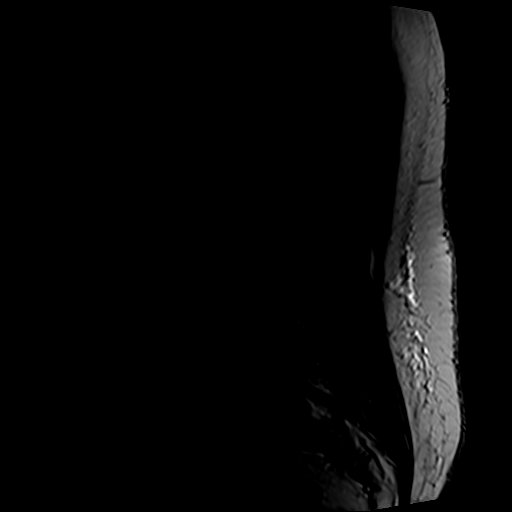
[im 7/16]
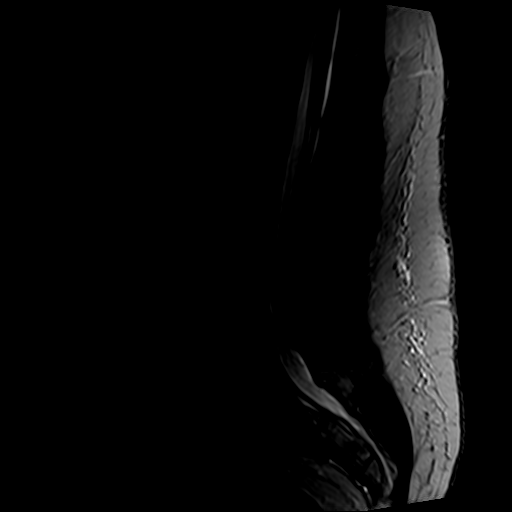
[im 10/16]
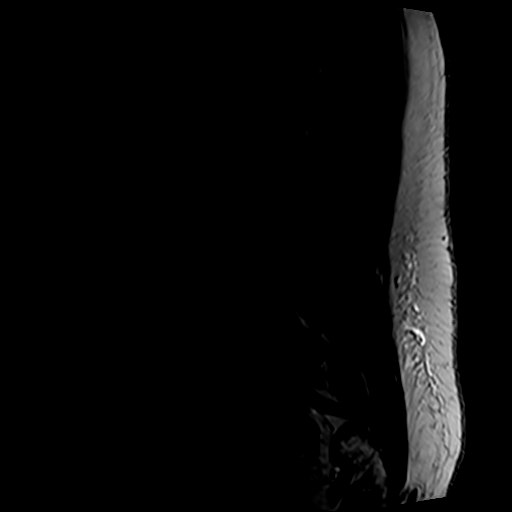
[im 13/16]
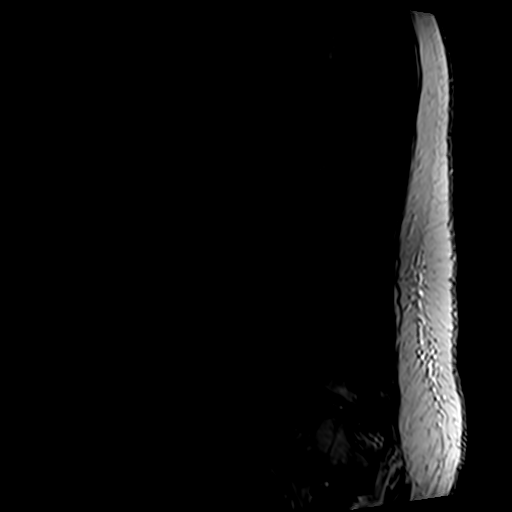
[im 16/16]
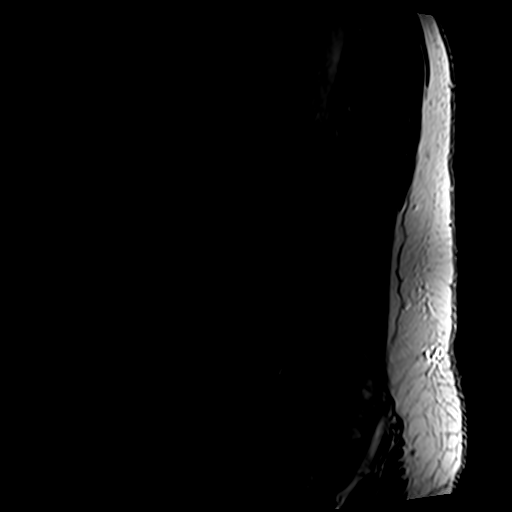

[Series 4: T1 · sagittal · 4.0mm · 0.55mm/px · 6 of 16 slices shown (1 of 2)]
[im 1/16]
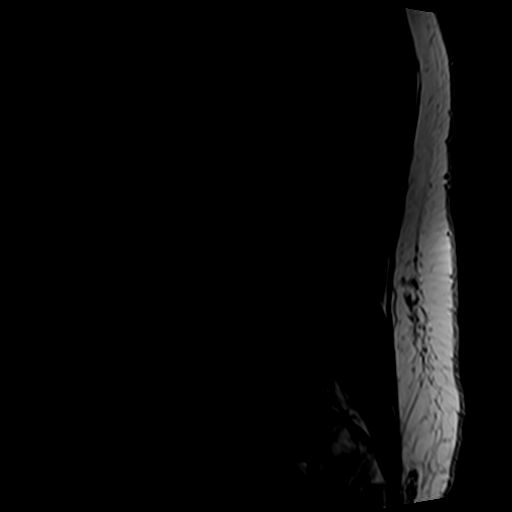
[im 4/16]
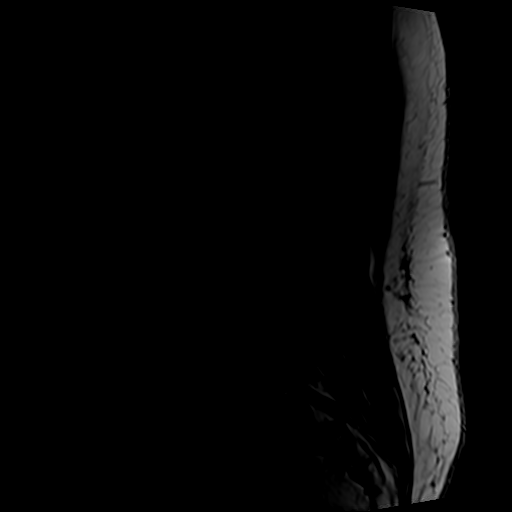
[im 7/16]
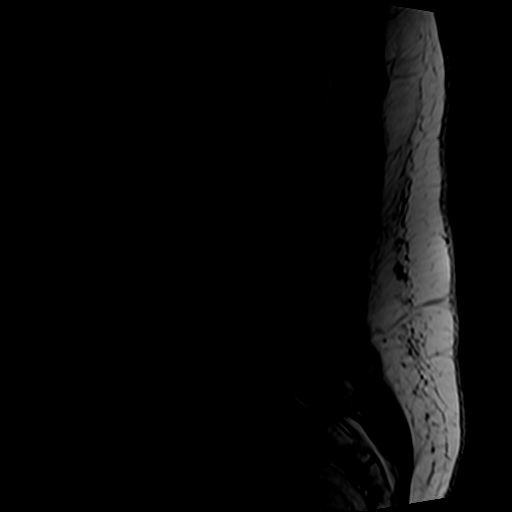
[im 10/16]
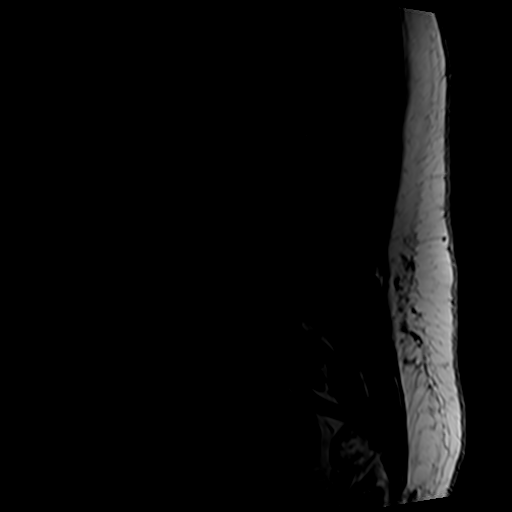
[im 13/16]
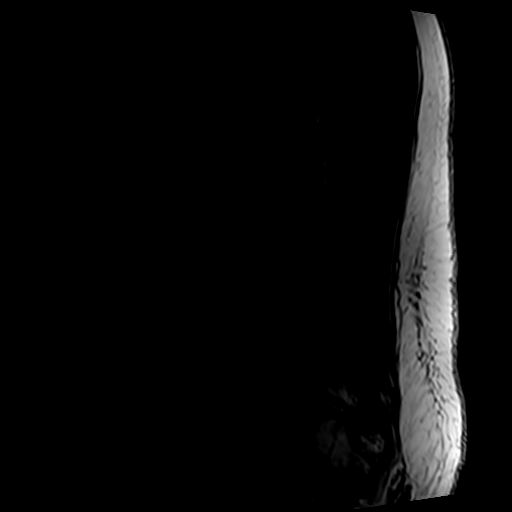
[im 16/16]
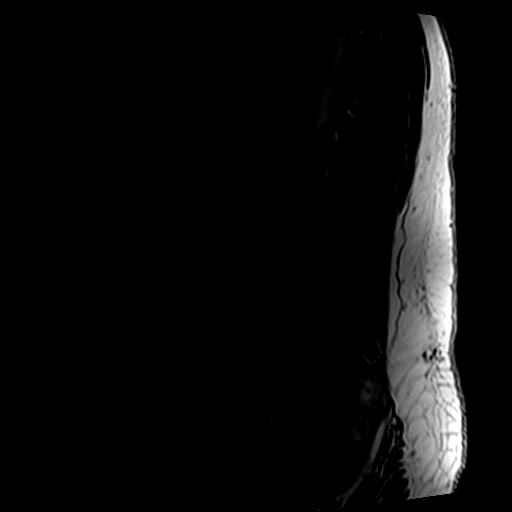

[Series 5: T2 · axial · 4.0mm · 0.70mm/px · z∈[-94,+143]mm · 9 of 42 slices shown (2 of 2)]
[im 1/42]
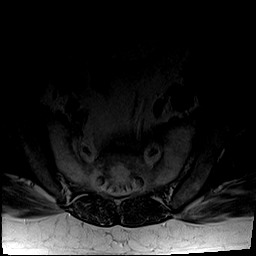
[im 6/42]
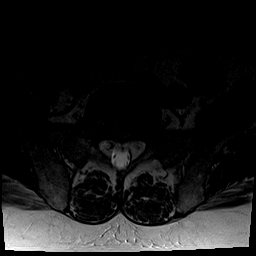
[im 12/42]
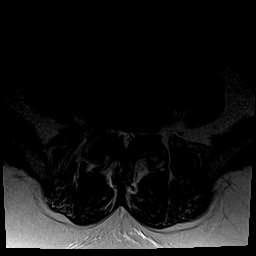
[im 18/42]
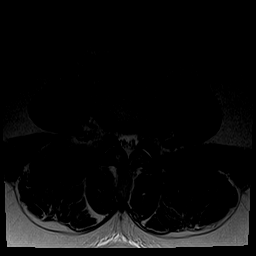
[im 21/42]
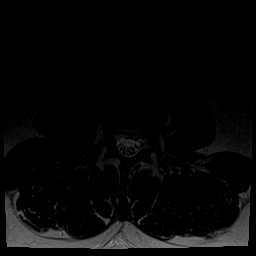
[im 24/42]
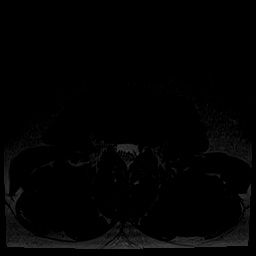
[im 30/42]
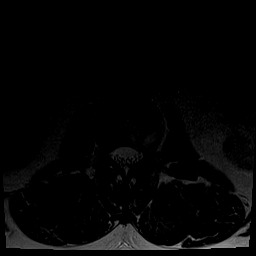
[im 36/42]
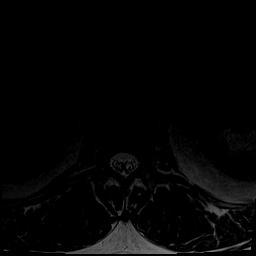
[im 42/42]
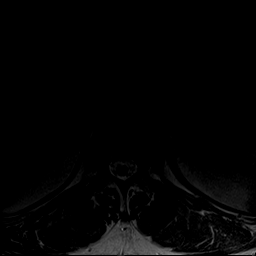

[Series 6: T1 · axial · 4.0mm · 0.35mm/px · z∈[-94,+112]mm · 4 of 42 slices shown (2 of 2)]
[im 1/42]
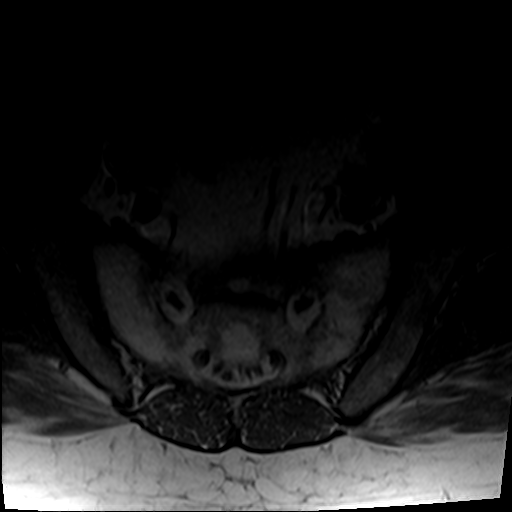
[im 6/42]
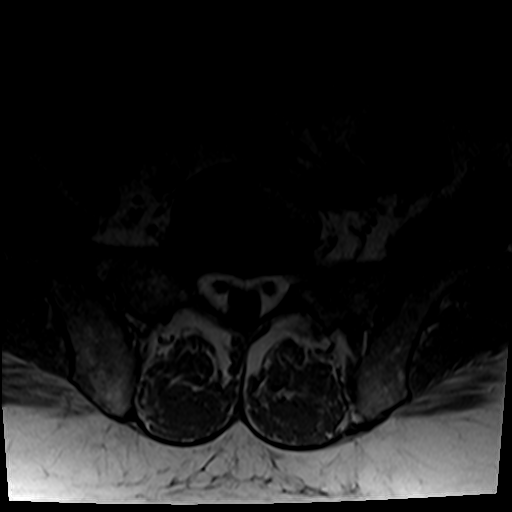
[im 21/42]
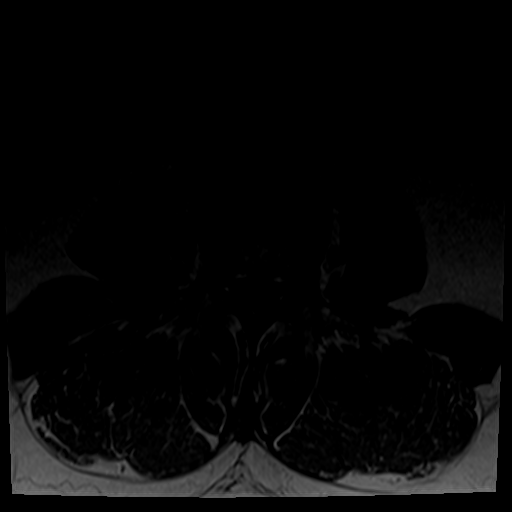
[im 36/42]
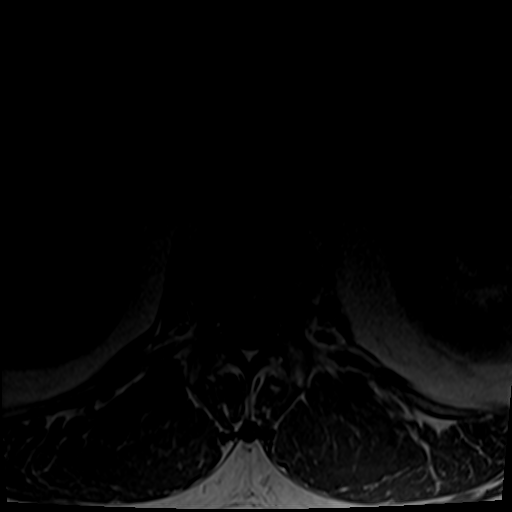

[25 of 48 positions shown; findings below may reference images not displayed]

FINDINGS: Segmentation:  5 lumbar type vertebral bodies.

Alignment:  3 mm anterolisthesis L4-5.

Vertebrae:  No fracture or primary bone lesion.

Conus medullaris and cauda equina: Conus extends to the L1 level.
Conus and cauda equina appear normal.

Paraspinal and other soft tissues: Negative

Disc levels:

No abnormality at L2-3 or above.

L3-4: Shallow protrusion of the disc. Mild facet hypertrophy. Mild
narrowing of the lateral recesses but no visible neural compression.

L4-5: Advanced bilateral facet arthropathy with edematous change
worse on the right than the left. 3 mm anterolisthesis because of
this. Bulging of the disc. Stenosis of both lateral recesses and
neural foramina that could cause neural compression on either or
both sides.

L5-S1: Bulging of the disc. Mild facet osteoarthritis. No canal or
foraminal stenosis.
IMPRESSION: The dominant findings are at L4-5. There is advanced bilateral facet
arthropathy with edematous change, worse on the right than the left.
This allows anterolisthesis of 3 mm. There is disc degeneration with
bulging of the disc. There is stenosis of both lateral recesses and
neural foramina that could cause neural compression on either or
both sides.

L3-4: Shallow disc protrusion. Mild facet hypertrophy. No apparent
compressive stenosis.

L5-S1: Mild disc bulge and facet osteoarthritis. No compressive
stenosis.

## 2021-07-06 ENCOUNTER — Other Ambulatory Visit (INDEPENDENT_AMBULATORY_CARE_PROVIDER_SITE_OTHER): Payer: Medicare Other

## 2021-07-06 DIAGNOSIS — E785 Hyperlipidemia, unspecified: Secondary | ICD-10-CM

## 2021-07-06 DIAGNOSIS — B179 Acute viral hepatitis, unspecified: Secondary | ICD-10-CM

## 2021-07-06 LAB — COMPREHENSIVE METABOLIC PANEL
ALT: 34 U/L (ref 0–53)
AST: 20 U/L (ref 0–37)
Albumin: 4.3 g/dL (ref 3.5–5.2)
Alkaline Phosphatase: 33 U/L — ABNORMAL LOW (ref 39–117)
BUN: 20 mg/dL (ref 6–23)
CO2: 27 mEq/L (ref 19–32)
Calcium: 9.2 mg/dL (ref 8.4–10.5)
Chloride: 103 mEq/L (ref 96–112)
Creatinine, Ser: 0.97 mg/dL (ref 0.40–1.50)
GFR: 81.66 mL/min (ref >60.00)
Glucose, Bld: 101 mg/dL — ABNORMAL HIGH (ref 70–99)
Potassium: 4.1 mEq/L (ref 3.5–5.1)
Sodium: 137 mEq/L (ref 135–145)
Total Bilirubin: 0.9 mg/dL (ref 0.2–1.2)
Total Protein: 6.7 g/dL (ref 6.0–8.3)

## 2021-07-06 LAB — LIPID PANEL
Cholesterol: 197 mg/dL (ref 0–200)
HDL: 48 mg/dL (ref >39.00)
LDL Cholesterol: 131 mg/dL — ABNORMAL HIGH (ref 0–99)
NonHDL: 148.6
Total CHOL/HDL Ratio: 4
Triglycerides: 89 mg/dL (ref 0.0–149.0)
VLDL: 17.8 mg/dL (ref 0.0–40.0)

## 2021-07-14 ENCOUNTER — Telehealth (INDEPENDENT_AMBULATORY_CARE_PROVIDER_SITE_OTHER): Payer: Medicare Other | Admitting: Internal Medicine

## 2021-07-14 ENCOUNTER — Encounter: Payer: Self-pay | Admitting: Internal Medicine

## 2021-07-14 DIAGNOSIS — I2510 Atherosclerotic heart disease of native coronary artery without angina pectoris: Secondary | ICD-10-CM

## 2021-07-14 DIAGNOSIS — K8021 Calculus of gallbladder without cholecystitis with obstruction: Secondary | ICD-10-CM

## 2021-07-14 DIAGNOSIS — I2583 Coronary atherosclerosis due to lipid rich plaque: Secondary | ICD-10-CM

## 2021-07-14 DIAGNOSIS — Z789 Other specified health status: Secondary | ICD-10-CM | POA: Diagnosis not present

## 2021-07-14 DIAGNOSIS — B179 Acute viral hepatitis, unspecified: Secondary | ICD-10-CM

## 2021-07-14 NOTE — Assessment & Plan Note (Signed)
Off statin - LFTs are nl

## 2021-07-14 NOTE — Assessment & Plan Note (Signed)
Statin intolerant Take fish oil for now F/u w/dr Radford Pax

## 2021-07-14 NOTE — Assessment & Plan Note (Signed)
Will watch for sx's

## 2021-07-14 NOTE — Assessment & Plan Note (Signed)
Pravastatin caused hepatitis

## 2021-07-14 NOTE — Progress Notes (Signed)
Virtual Visit via Video Note  I connected with Joe Williams on 07/14/21 at  9:30 AM EDT by a video enabled telemedicine application and verified that I am speaking with the correct person using two identifiers.   I discussed the limitations of evaluation and management by telemedicine and the availability of in person appointments. The patient expressed understanding and agreed to proceed.  I was located at our [facility] office. The patient was at home. There was no one else present in the visit.   History of Present Illness: We need to follow-up on CAD, elevated LFTs Pt had COVID, - (+) test last Wed  There has been no chest pain, shortness of breath, abdominal pain, diarrhea.   Observations/Objective: The patient appears to be in no acute distress, looks well.  Assessment and Plan:  See my Assessment and Plan. Follow Up Instructions:    I discussed the assessment and treatment plan with the patient. The patient was provided an opportunity to ask questions and all were answered. The patient agreed with the plan and demonstrated an understanding of the instructions.   The patient was advised to call back or seek an in-person evaluation if the symptoms worsen or if the condition fails to improve as anticipated.  I provided face-to-face time during this encounter. We were at different locations.   Walker Kehr, MD

## 2021-09-08 ENCOUNTER — Encounter: Payer: Self-pay | Admitting: Cardiology

## 2021-09-08 ENCOUNTER — Ambulatory Visit: Payer: Medicare Other | Admitting: Cardiology

## 2021-09-08 ENCOUNTER — Other Ambulatory Visit: Payer: Self-pay

## 2021-09-08 VITALS — BP 112/70 | HR 57 | Ht 72.0 in | Wt 238.2 lb

## 2021-09-08 DIAGNOSIS — E785 Hyperlipidemia, unspecified: Secondary | ICD-10-CM | POA: Diagnosis not present

## 2021-09-08 DIAGNOSIS — I2510 Atherosclerotic heart disease of native coronary artery without angina pectoris: Secondary | ICD-10-CM | POA: Diagnosis not present

## 2021-09-08 DIAGNOSIS — R002 Palpitations: Secondary | ICD-10-CM

## 2021-09-08 NOTE — Addendum Note (Signed)
Addended by: Antonieta Iba on: 09/08/2021 08:19 AM   Modules accepted: Orders

## 2021-09-08 NOTE — Patient Instructions (Signed)
Medication Instructions:  Your physician recommends that you continue on your current medications as directed. Please refer to the Current Medication list given to you today.  *If you need a refill on your cardiac medications before your next appointment, please call your pharmacy*  Follow-Up: At [facility], you and your health needs are our priority.  As part of our continuing mission to provide you with exceptional heart care, we have created designated Provider Care Teams.  These Care Teams include your primary Cardiologist (physician) and Advanced Practice Providers (APPs -  Physician Assistants and Nurse Practitioners) who all work together to provide you with the care you need, when you need it.  Your next appointment:   1 year(s)  The format for your next appointment:   In Person  Provider:   You may see Fransico Him, MD or one of the following Advanced Practice Providers on your designated Care Team:   Melina Copa, PA-C Ermalinda Barrios, PA-C   Other Instructions You have been referred to see our PharmD in the [facility]

## 2021-09-08 NOTE — Progress Notes (Signed)
_   Date:  09/08/2021   ID:  Joe Williams, DOB 01-16-1956, MRN [id number]   PCP:  Cassandria Anger, MD  Cardiologist:  NEW Electrophysiologist:  None   Chief Complaint:  Coronary artery calcifications  History of Present Illness:    Joe Williams is a 65 y.o. male  with a hx of HLD, tobacco use, glucose intolerance and family hx of CAD who underwent coronary calcium score to assess future risk for cardiovascular dz.  His mom died of an MI in her mid 53's. Coronary calcium score was 242.   He is here today for followup and is doing well.  HE denies any chest pain or pressure, SOB, DOE, PND, orthopnea, LE edema, dizziness, palpitations or syncope. He is compliant with his meds and is tolerating meds with no SE.    Prior CV studies:   The following studies were reviewed today:  Chest CT coronary Calcium score  Past Medical History:  Diagnosis Date   COLONIC POLYPS, HX OF    rectal-hyperplastic only 2007   DYSFUNCTION, SINOATRIAL NODE    chronic sinus brady   GERD    GLUCOSE INTOLERANCE    History of shingles    HYPERLIPIDEMIA    Morbid obesity (HCC)    PLANTAR FASCIITIS    TOBACCO USE, QUIT    Past Surgical History:  Procedure Laterality Date   COLONOSCOPY     ROTATOR CUFF REPAIR  1995 (approx)   right -Dr. Mayer Camel     Current Meds  Medication Sig   aspirin EC 81 MG tablet Take 81 mg by mouth daily.   cetirizine (ZYRTEC) 10 MG tablet Take 10 mg by mouth at bedtime as needed for allergies.   cholecalciferol (VITAMIN D) 1000 UNITS tablet Take 1,000 Units by mouth daily.   melatonin 5 MG TABS Take 10 mg by mouth at bedtime.   Menthol, Topical Analgesic, 4 % GEL Apply 1 application topically as needed. To knees   omeprazole (PRILOSEC) 40 MG capsule Take 1 capsule (40 mg total) by mouth daily.   potassium gluconate 595 (99 K) MG TABS tablet Take 1 tablet by mouth daily.   tamsulosin (FLOMAX) 0.4 MG CAPS capsule TAKE 1 CAPSULE BY MOUTH EVERY DAY   vitamin C (ASCORBIC  ACID) 500 MG tablet Take 500 mg by mouth daily.     Allergies:   Lovastatin, Pravastatin, and Tetracycline   Social History   Tobacco Use   Smoking status: Former   Smokeless tobacco: Never  Substance Use Topics   Alcohol use: Yes    Alcohol/week: 3.0 standard drinks    Types: 3 Cans of beer per week    Comment: wknd   Drug use: No     Family Hx: The patient's family history includes Dementia in an other family member; Gallbladder disease in his mother; Heart disease in his mother; Ulcers in his father and sister. There is no history of Colon cancer.  ROS:   Please see the history of present illness.     All other systems reviewed and are negative.   Labs/Other Tests and Data Reviewed:    Recent Labs: 05/03/2021: Hemoglobin 15.0; Platelets 194; TSH 0.456 07/06/2021: ALT 34; BUN 20; Creatinine, Ser 0.97; Potassium 4.1; Sodium 137   Recent Lipid Panel Lab Results  Component Value Date/Time   CHOL 197 07/06/2021 08:38 AM   CHOL 136 09/08/2020 07:56 AM   TRIG 89.0 07/06/2021 08:38 AM   HDL 48.00 07/06/2021 08:38 AM  HDL 48 09/08/2020 07:56 AM   CHOLHDL 4 07/06/2021 08:38 AM   LDLCALC 131 (H) 07/06/2021 08:38 AM   LDLCALC 73 09/08/2020 07:56 AM    Wt Readings from Last 3 Encounters:  09/08/21 238 lb 3.2 oz (108 kg)  05/12/21 239 lb 3.2 oz (108.5 kg)  05/06/21 245 lb (111.1 kg)     Objective:    Vital Signs:  BP 112/70   Pulse (!) 57   Ht 6' (1.829 m)   Wt 238 lb 3.2 oz (108 kg)   SpO2 97%   BMI 32.31 kg/m    GEN: Well nourished, well developed in no acute distress HEENT: Normal NECK: No JVD; No carotid bruits LYMPHATICS: No lymphadenopathy CARDIAC:RRR, no murmurs, rubs, gallops RESPIRATORY:  Clear to auscultation without rales, wheezing or rhonchi  ABDOMEN: Soft, non-tender, non-distended MUSCULOSKELETAL:  No edema; No deformity  SKIN: Warm and dry NEUROLOGIC:  Alert and oriented x 3 PSYCHIATRIC:  Normal affect   ASSESSMENT & PLAN:    1.  Coronary  artery calcifications -noted on Chest CT at 272.   -lexiscan myoview 08/2019 with no ischemia -He has not had any anginal symptoms since I saw him last -continue ASA and statin  2.  HLD -LDL goal < 70 -I have personally reviewed and interpreted outside labs performed by patient's PCP which showed LDL 131, HDL 48, triglycerides 89, ALT 34 on July 06, 2021 -he currently is off statin therapy due to transaminitis which resolved off statins -refer to lipid clinic for possible PCSK9i   3.  Palpitations -these occurred after the first COVID 19 vaccine and a few times after the second vaccine but since has subsided. -Event monitor was ordered but never done -he has not had any further palpitations   Medication Adjustments/Labs and Tests Ordered: Current medicines are reviewed at length with the patient today.  Concerns regarding medicines are outlined above.  Medication changes, Labs and Tests ordered today are listed in the Patient Instructions below.  There are no Patient Instructions on file for this visit.   Signed, Fransico Him, MD  09/08/2021 8:09 AM    [facility] [place], [place], [place]  [address] Phone: [phone]; Fax: [phone]

## 2021-09-22 NOTE — Progress Notes (Signed)
Patient ID: Joe Williams                 DOB: 13-Nov-1956                    MRN: [id number]     HPI: Joe Williams is a 65 y.o. male patient referred to lipid clinic by Dr Radford Pax. PMH is significant for HLD, FHx of CAD, tobacco use, glucose intolerance, and elevated calcium score of 242 (75th percentile for age and sex matched control) in 05/2019. Lexiscan myoview 08/2019 showed no evidence of ischemia. Presented to ED 04/2021 with chest pain, N/V. Troponin negative, LFTs increased with ALT up to peak of 597. Unclear etiology, hepatitis panel negative, APAP, EtOH, UDS negative. Advised to avoid APAP, stop saw palmetto supplement, and hold statin until repeat outpatient hepatic panel which has since normalized. Possible etiology recently passed gallstone but unclear. Has now been off statin therapy since June and was referred to lipid clinic.  Pt presents today in good spirits. Took lovastatin in the past, may have stopped secondary to joint pain. Had been taking pravastatin for over a decade when his LFTs increased in June of 2022, he hasn't been restarted on statin since then. Used to take meloxicam as needed for knee and shoulder pain. Stopped both meloxicam and statin at the same time and hasn't had much pain since then, statin may have contributed but unsure.  Current Medications: none Intolerances: pravastatin 40-80mg  daily - LFT increase, lovastatin Risk Factors: elevated calcium score, FHx of CAD LDL goal: 70mg /dL  Diet: Eggs, bacon, chicken biscuit, cereal, granola, yogurt, or fruit for breakfast. Sandwich or leftovers for lunch. Dinner - [place], [place], [employer]er, [employer], [place], veggies, fish.  Exercise: walks, hunts  Family History: Mother died from MI in her mid 12s  Social History: Former tobacco use, denies drug use, occasional beer.  Labs: 07/06/21: TC 197, HDL 48, TG 89, LDL 131, ALT 34, AST 20 (no LLT)  Past Medical History:  Diagnosis Date   COLONIC POLYPS, HX OF     rectal-hyperplastic only 2007   DYSFUNCTION, SINOATRIAL NODE    chronic sinus brady   GERD    GLUCOSE INTOLERANCE    History of shingles    HYPERLIPIDEMIA    Morbid obesity ([place])    PLANTAR FASCIITIS    TOBACCO USE, QUIT     Current Outpatient Medications on File Prior to Visit  Medication Sig Dispense Refill   aspirin EC 81 MG tablet Take 81 mg by mouth daily.     Aspirin-Caffeine (BAYER BACK & BODY PO) Take 1 tablet by mouth daily as needed. (Patient not taking: Reported on 09/08/2021)     cetirizine (ZYRTEC) 10 MG tablet Take 10 mg by mouth at bedtime as needed for allergies.     cholecalciferol (VITAMIN D) 1000 UNITS tablet Take 1,000 Units by mouth daily.     Menthol, Topical Analgesic, 4 % GEL Apply 1 application topically as needed. To knees     omeprazole (PRILOSEC) 40 MG capsule Take 1 capsule (40 mg total) by mouth daily. 90 capsule 3   potassium gluconate 595 (99 K) MG TABS tablet Take 1 tablet by mouth daily.     tamsulosin (FLOMAX) 0.4 MG CAPS capsule TAKE 1 CAPSULE BY MOUTH EVERY DAY 90 capsule 3   vitamin C (ASCORBIC ACID) 500 MG tablet Take 500 mg by mouth daily.     No current facility-administered medications on file prior to visit.  Allergies  Allergen Reactions   Lovastatin     achy   Pravastatin     ??hepatitis   Tetracycline     Assessment/Plan:  1. Hyperlipidemia - LDL increased back up to 131 after statin was stopped this summer. Goal < 70 due to elevated calcium score. Unclear cause of his LFT elevation this summer, he had been taking pravastatin without issue for over a decade so unlikely that it was relate to his statin, may have been more related to passing a gallstone. Feel that statin rechallenge is safe at this time, his LFTs have normalized. Discussed two options with pt of rechallenging with lower dose of rosuvastatin with close LFT monitoring or trying PCSK9i. Pt prefers to try statin therapy first. Will start rosuvastatin 10mg  daily and will  recheck LFTs in 2 weeks and again at 4 weeks (with lipid panel at 4 week mark). Next option would be Repatha if needed (higher copay ~$45/month).  Elexus Barman E. Kellene Mccleary, PharmD, BCACP, [facility] [address] [place], [place] [address] Phone: [phone]; Fax: [phone] 09/23/2021 11:15 AM

## 2021-09-23 ENCOUNTER — Ambulatory Visit: Payer: Medicare Other | Admitting: Pharmacist

## 2021-09-23 ENCOUNTER — Other Ambulatory Visit: Payer: Self-pay

## 2021-09-23 DIAGNOSIS — E785 Hyperlipidemia, unspecified: Secondary | ICD-10-CM

## 2021-09-23 MED ORDER — ROSUVASTATIN CALCIUM 10 MG PO TABS: 10.0000 mg | ORAL_TABLET | Freq: Every day | ORAL | 11 refills | Status: DC

## 2021-09-23 NOTE — Patient Instructions (Addendum)
It was nice to meet you today  Your baseline LDL cholesterol is 131 and your goal is < 70 because of your elevated calcium score  Start taking rosuvastatin (Crestor) 10mg  - 1 tablet once daily  Recheck liver enzymes in 2 weeks - 11/23  Recheck cholesterol and liver enzymes in 4 weeks (fasting) - 12/7  For labs you can come in any time after 7:30am and before ~4:30pm  Call Jinny Blossom, PharmD with any concerns before then [phone]  Repatha is another option we can try in the future if needed for your cholesterol. It lowers your LDL by 60% and is an injection given every 2 weeks

## 2021-10-06 ENCOUNTER — Other Ambulatory Visit: Payer: Medicare Other | Admitting: *Deleted

## 2021-10-06 ENCOUNTER — Other Ambulatory Visit: Payer: Self-pay

## 2021-10-06 DIAGNOSIS — E785 Hyperlipidemia, unspecified: Secondary | ICD-10-CM

## 2021-10-06 LAB — HEPATIC FUNCTION PANEL
ALT: 25 IU/L (ref 0–44)
AST: 20 IU/L (ref 0–40)
Albumin: 4.6 g/dL (ref 3.8–4.8)
Alkaline Phosphatase: 40 IU/L — ABNORMAL LOW (ref 44–121)
Bilirubin Total: 0.5 mg/dL (ref 0.0–1.2)
Bilirubin, Direct: 0.19 mg/dL (ref 0.00–0.40)
Total Protein: 6.7 g/dL (ref 6.0–8.5)

## 2021-10-20 ENCOUNTER — Other Ambulatory Visit: Payer: Medicare Other | Admitting: *Deleted

## 2021-10-20 ENCOUNTER — Other Ambulatory Visit: Payer: Self-pay

## 2021-10-20 DIAGNOSIS — E785 Hyperlipidemia, unspecified: Secondary | ICD-10-CM

## 2021-10-20 LAB — HEPATIC FUNCTION PANEL
ALT: 29 IU/L (ref 0–44)
AST: 22 IU/L (ref 0–40)
Albumin: 4.5 g/dL (ref 3.8–4.8)
Alkaline Phosphatase: 39 IU/L — ABNORMAL LOW (ref 44–121)
Bilirubin Total: 0.5 mg/dL (ref 0.0–1.2)
Bilirubin, Direct: 0.16 mg/dL (ref 0.00–0.40)
Total Protein: 6.5 g/dL (ref 6.0–8.5)

## 2021-10-20 LAB — LIPID PANEL
Chol/HDL Ratio: 2.6 ratio (ref 0.0–5.0)
Cholesterol, Total: 118 mg/dL (ref 100–199)
HDL: 46 mg/dL (ref >39)
LDL Chol Calc (NIH): 55 mg/dL (ref 0–99)
Triglycerides: 90 mg/dL (ref 0–149)
VLDL Cholesterol Cal: 17 mg/dL (ref 5–40)

## 2021-12-17 ENCOUNTER — Telehealth: Payer: Self-pay | Admitting: Internal Medicine

## 2021-12-17 DIAGNOSIS — R739 Hyperglycemia, unspecified: Secondary | ICD-10-CM

## 2021-12-17 DIAGNOSIS — Z0000 Encounter for general adult medical examination without abnormal findings: Secondary | ICD-10-CM

## 2021-12-17 NOTE — Telephone Encounter (Signed)
Done thx

## 2021-12-17 NOTE — Telephone Encounter (Signed)
Patient calling in  Patient would like provider to place order for labs before CPE scheduled for 02/13  Advised patient possibility insurance will not cover labs before cpe appt.. patient still agreed to have lab orders placed

## 2021-12-17 NOTE — Telephone Encounter (Signed)
Pt has [employer] medicare will hold until MD return 12/21/21.Marland KitchenJohny Williams

## 2021-12-24 ENCOUNTER — Other Ambulatory Visit (INDEPENDENT_AMBULATORY_CARE_PROVIDER_SITE_OTHER): Payer: Medicare Other

## 2021-12-24 DIAGNOSIS — Z0000 Encounter for general adult medical examination without abnormal findings: Secondary | ICD-10-CM

## 2021-12-24 DIAGNOSIS — R739 Hyperglycemia, unspecified: Secondary | ICD-10-CM | POA: Diagnosis not present

## 2021-12-24 LAB — LIPID PANEL
Cholesterol: 112 mg/dL (ref 0–200)
HDL: 50.6 mg/dL (ref >39.00)
LDL Cholesterol: 48 mg/dL (ref 0–99)
NonHDL: 61.65
Total CHOL/HDL Ratio: 2
Triglycerides: 67 mg/dL (ref 0.0–149.0)
VLDL: 13.4 mg/dL (ref 0.0–40.0)

## 2021-12-24 LAB — URINALYSIS
Bilirubin Urine: NEGATIVE
Hgb urine dipstick: NEGATIVE
Ketones, ur: NEGATIVE
Leukocytes,Ua: NEGATIVE
Nitrite: NEGATIVE
Specific Gravity, Urine: 1.015 (ref 1.000–1.030)
Total Protein, Urine: NEGATIVE
Urine Glucose: NEGATIVE
Urobilinogen, UA: 0.2 (ref 0.0–1.0)
pH: 6.5 (ref 5.0–8.0)

## 2021-12-24 LAB — CBC WITH DIFFERENTIAL/PLATELET
Basophils Absolute: 0 10*3/uL (ref 0.0–0.1)
Basophils Relative: 1.1 % (ref 0.0–3.0)
Eosinophils Absolute: 0.2 10*3/uL (ref 0.0–0.7)
Eosinophils Relative: 4.2 % (ref 0.0–5.0)
HCT: 42.7 % (ref 39.0–52.0)
Hemoglobin: 14.2 g/dL (ref 13.0–17.0)
Lymphocytes Relative: 32.9 % (ref 12.0–46.0)
Lymphs Abs: 1.3 10*3/uL (ref 0.7–4.0)
MCHC: 33.4 g/dL (ref 30.0–36.0)
MCV: 86.8 fl (ref 78.0–100.0)
Monocytes Absolute: 0.3 10*3/uL (ref 0.1–1.0)
Monocytes Relative: 8.4 % (ref 3.0–12.0)
Neutro Abs: 2.2 10*3/uL (ref 1.4–7.7)
Neutrophils Relative %: 53.4 % (ref 43.0–77.0)
Platelets: 160 10*3/uL (ref 150.0–400.0)
RBC: 4.92 Mil/uL (ref 4.22–5.81)
RDW: 13.9 % (ref 11.5–15.5)
WBC: 4.1 10*3/uL (ref 4.0–10.5)

## 2021-12-24 LAB — COMPREHENSIVE METABOLIC PANEL
ALT: 28 U/L (ref 0–53)
AST: 25 U/L (ref 0–37)
Albumin: 4.3 g/dL (ref 3.5–5.2)
Alkaline Phosphatase: 30 U/L — ABNORMAL LOW (ref 39–117)
BUN: 21 mg/dL (ref 6–23)
CO2: 30 mEq/L (ref 19–32)
Calcium: 9.1 mg/dL (ref 8.4–10.5)
Chloride: 104 mEq/L (ref 96–112)
Creatinine, Ser: 1.04 mg/dL (ref 0.40–1.50)
GFR: 74.86 mL/min (ref >60.00)
Glucose, Bld: 107 mg/dL — ABNORMAL HIGH (ref 70–99)
Potassium: 4.7 mEq/L (ref 3.5–5.1)
Sodium: 141 mEq/L (ref 135–145)
Total Bilirubin: 0.9 mg/dL (ref 0.2–1.2)
Total Protein: 6.5 g/dL (ref 6.0–8.3)

## 2021-12-24 LAB — HEMOGLOBIN A1C: Hgb A1c MFr Bld: 5.9 % (ref 4.6–6.5)

## 2021-12-24 LAB — TSH: TSH: 1.58 u[IU]/mL (ref 0.35–5.50)

## 2021-12-24 LAB — PSA: PSA: 1.33 ng/mL (ref 0.10–4.00)

## 2021-12-27 ENCOUNTER — Other Ambulatory Visit: Payer: Self-pay

## 2021-12-27 ENCOUNTER — Ambulatory Visit (INDEPENDENT_AMBULATORY_CARE_PROVIDER_SITE_OTHER): Payer: Medicare Other | Admitting: Internal Medicine

## 2021-12-27 ENCOUNTER — Encounter: Payer: Self-pay | Admitting: Internal Medicine

## 2021-12-27 DIAGNOSIS — Z0000 Encounter for general adult medical examination without abnormal findings: Secondary | ICD-10-CM

## 2021-12-27 NOTE — Assessment & Plan Note (Addendum)
We discussed age appropriate health related issues, including available/recomended screening tests and vaccinations. Labs were ordered to be later reviewed . All questions were answered. We discussed one or more of the following - seat belt use, use of sunscreen/sun exposure exercise, safe sex, fall risk reduction, second hand smoke exposure, firearm use and storage, seat belt use, a need for adhering to healthy diet and exercise. Labs were ordered.  All questions were answered. Colon 1/18, due in 1/28 2020 A cardiac CT scan for calcium scoring IMPRESSION: Coronary calcium score of 242. This was 75th percentile for age and sex matched control. Shingrix suggested

## 2021-12-27 NOTE — Patient Instructions (Addendum)
NB shoes Hoka Bondi 2  Spenco insoles  Shingrix vaccine

## 2021-12-27 NOTE — Progress Notes (Signed)
Subjective:  Patient ID: Joe Williams, male    DOB: 02-08-1956  Age: 66 y.o. MRN: [id number]  CC: No chief complaint on file.   HPI Joe Williams presents for a well exam  Outpatient Medications Prior to Visit  Medication Sig Dispense Refill   aspirin EC 81 MG tablet Take 81 mg by mouth daily.     cholecalciferol (VITAMIN D) 1000 UNITS tablet Take 1,000 Units by mouth daily.     meloxicam (MOBIC) 15 MG tablet Take 15 mg by mouth daily.     Menthol, Topical Analgesic, 4 % GEL Apply 1 application topically as needed. To knees     omeprazole (PRILOSEC) 40 MG capsule Take 1 capsule (40 mg total) by mouth daily. 90 capsule 3   potassium gluconate 595 (99 K) MG TABS tablet Take 1 tablet by mouth daily.     rosuvastatin (CRESTOR) 10 MG tablet Take 1 tablet (10 mg total) by mouth daily. 30 tablet 11   tamsulosin (FLOMAX) 0.4 MG CAPS capsule TAKE 1 CAPSULE BY MOUTH EVERY DAY 90 capsule 3   vitamin C (ASCORBIC ACID) 500 MG tablet Take 500 mg by mouth daily.     No facility-administered medications prior to visit.    ROS: Review of Systems  Constitutional:  Negative for appetite change, fatigue and unexpected weight change.  HENT:  Negative for congestion, nosebleeds, sneezing, sore throat and trouble swallowing.   Eyes:  Negative for itching and visual disturbance.  Respiratory:  Negative for cough.   Cardiovascular:  Negative for chest pain, palpitations and leg swelling.  Gastrointestinal:  Negative for abdominal distention, blood in stool, diarrhea and nausea.  Genitourinary:  Negative for frequency and hematuria.  Musculoskeletal:  Positive for arthralgias and gait problem. Negative for back pain, joint swelling and neck pain.  Skin:  Negative for rash.  Neurological:  Negative for dizziness, tremors, speech difficulty and weakness.  Psychiatric/Behavioral:  Negative for agitation, dysphoric mood, sleep disturbance and suicidal ideas. The patient is not nervous/anxious.     Objective:  BP 122/78 (BP Location: Left Arm, Patient Position: Sitting, Cuff Size: Large)    Pulse (!) 58    Temp 98.1 F (36.7 C) (Oral)    Ht 6' (1.829 m)    Wt 248 lb (112.5 kg)    SpO2 96%    BMI 33.63 kg/m   BP Readings from Last 3 Encounters:  12/27/21 122/78  09/08/21 112/70  05/12/21 112/70    Wt Readings from Last 3 Encounters:  12/27/21 248 lb (112.5 kg)  09/08/21 238 lb 3.2 oz (108 kg)  05/12/21 239 lb 3.2 oz (108.5 kg)    Physical Exam Constitutional:      General: He is not in acute distress.    Appearance: He is well-developed.     Comments: NAD  Eyes:     Conjunctiva/sclera: Conjunctivae normal.     Pupils: Pupils are equal, round, and reactive to light.  Neck:     Thyroid: No thyromegaly.     Vascular: No JVD.  Cardiovascular:     Rate and Rhythm: Normal rate and regular rhythm.     Heart sounds: Normal heart sounds. No murmur heard.   No friction rub. No gallop.  Pulmonary:     Effort: Pulmonary effort is normal. No respiratory distress.     Breath sounds: Normal breath sounds. No wheezing or rales.  Chest:     Chest wall: No tenderness.  Abdominal:     General: Bowel  sounds are normal. There is no distension.     Palpations: Abdomen is soft. There is no mass.     Tenderness: There is no abdominal tenderness. There is no guarding or rebound.  Genitourinary:    Rectum: Normal. Guaiac result negative.  Musculoskeletal:        General: No tenderness. Normal range of motion.     Cervical back: Normal range of motion.  Lymphadenopathy:     Cervical: No cervical adenopathy.  Skin:    General: Skin is warm and dry.     Findings: No rash.  Neurological:     Mental Status: He is alert and oriented to person, place, and time.     Cranial Nerves: No cranial nerve deficit.     Motor: No abnormal muscle tone.     Coordination: Coordination normal.     Gait: Gait normal.     Deep Tendon Reflexes: Reflexes are normal and symmetric.  Psychiatric:         Behavior: Behavior normal.        Thought Content: Thought content normal.        Judgment: Judgment normal.  Prostate 1+  Lab Results  Component Value Date   WBC 4.1 12/24/2021   HGB 14.2 12/24/2021   HCT 42.7 12/24/2021   PLT 160.0 12/24/2021   GLUCOSE 107 (H) 12/24/2021   CHOL 112 12/24/2021   TRIG 67.0 12/24/2021   HDL 50.60 12/24/2021   LDLCALC 48 12/24/2021   ALT 28 12/24/2021   AST 25 12/24/2021   NA 141 12/24/2021   K 4.7 12/24/2021   CL 104 12/24/2021   CREATININE 1.04 12/24/2021   BUN 21 12/24/2021   CO2 30 12/24/2021   TSH 1.58 12/24/2021   PSA 1.33 12/24/2021   INR 1.2 05/03/2021   HGBA1C 5.9 12/24/2021    DG Chest 2 View  Result Date: 05/02/2021 CLINICAL DATA:  Posterior chest pain and shortness of breath for 5 hours. Nausea. EXAM: CHEST - 2 VIEW COMPARISON:  10/25/2011 FINDINGS: The heart size and mediastinal contours are within normal limits. Both lungs are clear. The visualized skeletal structures are unremarkable. IMPRESSION: No active cardiopulmonary disease. Electronically Signed   By: Marlaine Hind M.D.   On: 05/02/2021 10:34   MR 3D Recon At Scanner  Result Date: 05/03/2021 CLINICAL DATA:  Cholelithiasis. Epigastric pain. Gallstones on ultrasound 1 day prior. EXAM: MRI ABDOMEN WITHOUT AND WITH CONTRAST (INCLUDING MRCP) TECHNIQUE: Multiplanar multisequence MR imaging of the abdomen was performed both before and after the administration of intravenous contrast. Heavily T2-weighted images of the biliary and pancreatic ducts were obtained, and three-dimensional MRCP images were rendered by post processing. CONTRAST:  30mL GADAVIST GADOBUTROL 1 MMOL/ML IV SOLN COMPARISON:  None. FINDINGS: Lower chest:  Lung bases are clear. Hepatobiliary: No intrahepatic biliary duct dilatation. Common bile duct is normal caliber. No filling defects in the common bile duct. There multiple faceted gallstones within the lumen the gallbladder. No gallbladder inflammation identified.  Small benign cyst in the lateral LEFT hepatic lobe. Pancreas: Pancreatic duct is normal caliber. Normal pancreatic ductal anatomy. No pancreatic inflammation or fluid collections. Spleen: Normal spleen. Adrenals/urinary tract: Adrenal glands and kidneys are normal. Stomach/Bowel: Stomach and limited of the small bowel is unremarkable Vascular/Lymphatic: Abdominal aortic normal caliber. No retroperitoneal periportal lymphadenopathy. Musculoskeletal: No aggressive osseous lesion IMPRESSION: 1. Cholelithiasis without evidence cholecystitis. 2. No choledocholithiasis.  Normal biliary tree. 3. Normal pancreas. Electronically Signed   By: Suzy Bouchard M.D.   On: 05/03/2021  14:34   MR ABDOMEN MRCP W WO CONTAST  Result Date: 05/03/2021 CLINICAL DATA:  Cholelithiasis. Epigastric pain. Gallstones on ultrasound 1 day prior. EXAM: MRI ABDOMEN WITHOUT AND WITH CONTRAST (INCLUDING MRCP) TECHNIQUE: Multiplanar multisequence MR imaging of the abdomen was performed both before and after the administration of intravenous contrast. Heavily T2-weighted images of the biliary and pancreatic ducts were obtained, and three-dimensional MRCP images were rendered by post processing. CONTRAST:  63mL GADAVIST GADOBUTROL 1 MMOL/ML IV SOLN COMPARISON:  None. FINDINGS: Lower chest:  Lung bases are clear. Hepatobiliary: No intrahepatic biliary duct dilatation. Common bile duct is normal caliber. No filling defects in the common bile duct. There multiple faceted gallstones within the lumen the gallbladder. No gallbladder inflammation identified. Small benign cyst in the lateral LEFT hepatic lobe. Pancreas: Pancreatic duct is normal caliber. Normal pancreatic ductal anatomy. No pancreatic inflammation or fluid collections. Spleen: Normal spleen. Adrenals/urinary tract: Adrenal glands and kidneys are normal. Stomach/Bowel: Stomach and limited of the small bowel is unremarkable Vascular/Lymphatic: Abdominal aortic normal caliber. No  retroperitoneal periportal lymphadenopathy. Musculoskeletal: No aggressive osseous lesion IMPRESSION: 1. Cholelithiasis without evidence cholecystitis. 2. No choledocholithiasis.  Normal biliary tree. 3. Normal pancreas. Electronically Signed   By: Suzy Bouchard M.D.   On: 05/03/2021 14:34   CT Angio Chest/Abd/Pel for Dissection W and/or W/WO  Result Date: 05/02/2021 CLINICAL DATA:  Patient with pain between the scapulas in the lumbar area. EXAM: CT ANGIOGRAPHY CHEST, ABDOMEN AND PELVIS TECHNIQUE: Chest radiograph earlier same day. Multidetector CT imaging through the chest, abdomen and pelvis was performed using the standard protocol during bolus administration of intravenous contrast. Multiplanar reconstructed images and MIPs were obtained and reviewed to evaluate the vascular anatomy. CONTRAST:  178mL OMNIPAQUE IOHEXOL 350 MG/ML SOLN COMPARISON:  None. FINDINGS: CTA CHEST FINDINGS Cardiovascular: Normal heart size. Trace fluid superior pericardial recess. Thoracic aortic vascular calcifications. Motion artifact limits evaluation of the aortic root. Noncontrast images through the chest demonstrate no high density within the thoracic aorta is to suggest intramural hematoma. Origin of the great vessels are patent. No evidence for thoracic aortic dissection. Mediastinum/Nodes: No enlarged axillary, mediastinal or hilar lymphadenopathy. Normal appearance of the esophagus. Lungs/Pleura: Central airways are patent. No large area pulmonary consolidation. No pleural effusion or pneumothorax. Musculoskeletal: Thoracic spine degenerative changes. No aggressive or acute appearing osseous lesions. Review of the MIP images confirms the above findings. CTA ABDOMEN AND PELVIS FINDINGS VASCULAR Aorta: Normal caliber aorta without aneurysm, dissection, vasculitis or significant stenosis. Celiac: Patent without evidence of aneurysm, dissection, vasculitis or significant stenosis. SMA: Patent without evidence of aneurysm,  dissection, vasculitis or significant stenosis. Renals: Both renal arteries are patent without evidence of aneurysm, dissection, vasculitis, fibromuscular dysplasia or significant stenosis. IMA: Patent without evidence of aneurysm, dissection, vasculitis or significant stenosis. Inflow: Patent without evidence of aneurysm, dissection, vasculitis or significant stenosis. Veins: No obvious venous abnormality within the limitations of this arterial phase study. Review of the MIP images confirms the above findings. NON-VASCULAR Hepatobiliary: Liver is normal in size and contour. Gallbladder is unremarkable. Subcentimeter too small to characterize low-attenuation lesion left hepatic lobe (image 81; series 5). No intrahepatic or extrahepatic biliary ductal dilatation. Pancreas: Unremarkable Spleen: Unremarkable Adrenals/Urinary Tract: Normal adrenal glands. Kidneys enhance symmetrically with contrast. There 1.5 cm exophytic cyst mid pole left kidney. Urinary bladder is unremarkable. Stomach/Bowel: No abnormal bowel wall thickening or evidence for bowel obstruction. No free fluid or free intraperitoneal air. Normal morphology of the stomach. Lymphatic: No retroperitoneal lymphadenopathy. Reproductive: Heterogeneous prostate. Other:  Small fat containing left inguinal hernia. Musculoskeletal: Lumbar spine degenerative changes. No aggressive or acute appearing osseous lesions. Review of the MIP images confirms the above findings. IMPRESSION: No CT evidence for thoracic or abdominal aortic dissection. No acute process identified within the chest, abdomen or pelvis. Electronically Signed   By: Lovey Newcomer M.D.   On: 05/02/2021 13:20   US Abdomen Limited RUQ (LIVER/GB)  Result Date: 05/02/2021 CLINICAL DATA:  Epigastric pain. EXAM: ULTRASOUND ABDOMEN LIMITED RIGHT UPPER QUADRANT COMPARISON:  CT from the same day. FINDINGS: Gallbladder: Multiple layering gallstones. No definite gallbladder wall thickening visualized. The  gallbladder wall measures 2.9 mm, upper limits of normal. No sonographic Murphy sign noted by sonographer. Common bile duct: Diameter: 2.7 mm Liver: No focal lesion identified. Within normal limits in parenchymal echogenicity. Portal vein is patent on color Doppler imaging with normal direction of blood flow towards the liver. Other: None. IMPRESSION: Cholelithiasis without sonographic evidence of acute cholecystitis. Normal appearance of the liver. Electronically Signed   By: Fidela Salisbury M.D.   On: 05/02/2021 14:30    Assessment & Plan:   Problem List Items Addressed This Visit     Well adult exam    We discussed age appropriate health related issues, including available/recomended screening tests and vaccinations. Labs were ordered to be later reviewed . All questions were answered. We discussed one or more of the following - seat belt use, use of sunscreen/sun exposure exercise, safe sex, fall risk reduction, second hand smoke exposure, firearm use and storage, seat belt use, a need for adhering to healthy diet and exercise. Labs were ordered.  All questions were answered. Colon 1/18, due in 1/28 2020 A cardiac CT scan for calcium scoring IMPRESSION: Coronary calcium score of 242. This was 75th percentile for age and sex matched control. Shingrix suggested         No orders of the defined types were placed in this encounter.     Follow-up: Return in about 1 year (around 12/27/2022) for Wellness Exam.  Walker Kehr, MD

## 2022-01-26 ENCOUNTER — Other Ambulatory Visit: Payer: Self-pay | Admitting: Internal Medicine

## 2022-01-26 DIAGNOSIS — L570 Actinic keratosis: Secondary | ICD-10-CM | POA: Insufficient documentation

## 2022-04-01 ENCOUNTER — Other Ambulatory Visit: Payer: Self-pay | Admitting: Internal Medicine

## 2022-04-27 ENCOUNTER — Telehealth: Payer: Self-pay | Admitting: Internal Medicine

## 2022-04-27 NOTE — Telephone Encounter (Signed)
Left message for patient to call back to schedule Medicare Annual Wellness Visit   Last AWV  07/15/21  Please schedule at anytime with [facility] if patient calls the office back.    90 Minutes appointment   Any questions, please call me at [phone]

## 2022-05-02 NOTE — Telephone Encounter (Signed)
Pt completes this appointment at home with [facility] nurse.

## 2022-05-27 ENCOUNTER — Encounter: Payer: Self-pay | Admitting: Internal Medicine

## 2022-06-01 ENCOUNTER — Ambulatory Visit: Payer: Medicare Other | Admitting: Internal Medicine

## 2022-06-01 DIAGNOSIS — L570 Actinic keratosis: Secondary | ICD-10-CM

## 2022-06-01 DIAGNOSIS — D1721 Benign lipomatous neoplasm of skin and subcutaneous tissue of right arm: Secondary | ICD-10-CM | POA: Diagnosis not present

## 2022-06-01 DIAGNOSIS — D1720 Benign lipomatous neoplasm of skin and subcutaneous tissue of unspecified limb: Secondary | ICD-10-CM | POA: Insufficient documentation

## 2022-06-01 NOTE — Patient Instructions (Signed)
   Postprocedure instructions :     Keep the wounds clean. You can wash them with liquid soap and water. Pat dry with gauze or a Kleenex tissue  Before applying antibiotic ointment and a Band-Aid.   You need to report immediately  if  any signs of infection develop.    

## 2022-06-01 NOTE — Assessment & Plan Note (Addendum)
R forearm x2 - more inflamed now See procedure Sun protection discussed

## 2022-06-01 NOTE — Assessment & Plan Note (Addendum)
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

## 2022-06-01 NOTE — Progress Notes (Signed)
Subjective:  Patient ID: Joe Williams, male    DOB: [birth year]  Age: 66 y.o. MRN: [id number]  CC: Spots on right arm   HPI Joe Williams presents for spots on R forearm - more inflamed now  Outpatient Medications Prior to Visit  Medication Sig Dispense Refill   aspirin EC 81 MG tablet Take 81 mg by mouth daily.     cholecalciferol (VITAMIN D) 1000 UNITS tablet Take 1,000 Units by mouth daily.     meloxicam (MOBIC) 15 MG tablet Take 15 mg by mouth daily.     Menthol, Topical Analgesic, 4 % GEL Apply 1 application topically as needed. To knees     omeprazole (PRILOSEC) 40 MG capsule TAKE 1 CAPSULE (40 MG TOTAL) BY MOUTH DAILY. 90 capsule 2   potassium gluconate 595 (99 K) MG TABS tablet Take 1 tablet by mouth daily.     rosuvastatin (CRESTOR) 10 MG tablet Take 1 tablet (10 mg total) by mouth daily. 30 tablet 11   tamsulosin (FLOMAX) 0.4 MG CAPS capsule TAKE 1 CAPSULE BY MOUTH EVERY DAY 90 capsule 3   vitamin C (ASCORBIC ACID) 500 MG tablet Take 500 mg by mouth daily.     No facility-administered medications prior to visit.    ROS: Review of Systems  Constitutional:  Negative for appetite change, fatigue and unexpected weight change.  HENT:  Negative for congestion, nosebleeds, sneezing, sore throat and trouble swallowing.   Eyes:  Negative for itching and visual disturbance.  Respiratory:  Negative for cough.   Cardiovascular:  Negative for chest pain, palpitations and leg swelling.  Gastrointestinal:  Negative for abdominal distention, blood in stool, diarrhea and nausea.  Genitourinary:  Negative for frequency and hematuria.  Musculoskeletal:  Negative for back pain, gait problem, joint swelling and neck pain.  Skin:  Negative for rash.  Neurological:  Negative for dizziness, tremors, speech difficulty and weakness.  Psychiatric/Behavioral:  Negative for agitation, dysphoric mood and sleep disturbance. The patient is not nervous/anxious.     Objective:  BP 128/72 (BP  Location: Right Arm, Patient Position: Sitting, Cuff Size: Large)   Pulse (!) 54   Temp 98 F (36.7 C) (Oral)   Ht 6' (1.829 m)   Wt 253 lb (114.8 kg)   SpO2 94%   BMI 34.31 kg/m   BP Readings from Last 3 Encounters:  06/01/22 128/72  12/27/21 122/78  09/08/21 112/70    Wt Readings from Last 3 Encounters:  06/01/22 253 lb (114.8 kg)  12/27/21 248 lb (112.5 kg)  09/08/21 238 lb 3.2 oz (108 kg)    Physical Exam Constitutional:      General: He is not in acute distress.    Appearance: He is well-developed.     Comments: NAD  Eyes:     Conjunctiva/sclera: Conjunctivae normal.     Pupils: Pupils are equal, round, and reactive to light.  Neck:     Thyroid: No thyromegaly.     Vascular: No JVD.  Cardiovascular:     Rate and Rhythm: Normal rate and regular rhythm.     Heart sounds: Normal heart sounds. No murmur heard.    No friction rub. No gallop.  Pulmonary:     Effort: Pulmonary effort is normal. No respiratory distress.     Breath sounds: Normal breath sounds. No wheezing or rales.  Chest:     Chest wall: No tenderness.  Abdominal:     General: Bowel sounds are normal. There is no distension.  Palpations: Abdomen is soft. There is no mass.     Tenderness: There is no abdominal tenderness. There is no guarding or rebound.  Musculoskeletal:        General: No tenderness. Normal range of motion.     Cervical back: Normal range of motion.  Lymphadenopathy:     Cervical: No cervical adenopathy.  Skin:    General: Skin is warm and dry.     Findings: No rash.  Neurological:     Mental Status: He is alert and oriented to person, place, and time.     Cranial Nerves: No cranial nerve deficit.     Motor: No abnormal muscle tone.     Coordination: Coordination normal.     Gait: Gait normal.     Deep Tendon Reflexes: Reflexes are normal and symmetric.  Psychiatric:        Behavior: Behavior normal.        Thought Content: Thought content normal.        Judgment:  Judgment normal.    2.5x2.0 cm R forearm distal radial aspect mobile oval lipoma (probable), NT 2 AKs   Procedure Note :     Procedure : Cryosurgery   Indication:  Actinic keratosis(es)   Risks including unsuccessful procedure , bleeding, infection, bruising, scar, a need for a repeat  procedure and others were explained to the patient in detail as well as the benefits. Informed consent was obtained verbally.   2  lesion(s)  on  R forearm  was/were treated with liquid nitrogen on a Q-tip in a usual fasion . Band-Aid was applied and antibiotic ointment was given for a later use.   Tolerated well. Complications none.   Postprocedure instructions :     Keep the wounds clean. You can wash them with liquid soap and water. Pat dry with gauze or a Kleenex tissue  Before applying antibiotic ointment and a Band-Aid.   You need to report immediately  if  any signs of infection develop.   Lab Results  Component Value Date   WBC 4.1 12/24/2021   HGB 14.2 12/24/2021   HCT 42.7 12/24/2021   PLT 160.0 12/24/2021   GLUCOSE 107 (H) 12/24/2021   CHOL 112 12/24/2021   TRIG 67.0 12/24/2021   HDL 50.60 12/24/2021   LDLCALC 48 12/24/2021   ALT 28 12/24/2021   AST 25 12/24/2021   NA 141 12/24/2021   K 4.7 12/24/2021   CL 104 12/24/2021   CREATININE 1.04 12/24/2021   BUN 21 12/24/2021   CO2 30 12/24/2021   TSH 1.58 12/24/2021   PSA 1.33 12/24/2021   INR 1.2 05/03/2021   HGBA1C 5.9 12/24/2021    MR ABDOMEN MRCP W WO CONTAST  Result Date: 05/03/2021 CLINICAL DATA:  Cholelithiasis. Epigastric pain. Gallstones on ultrasound 1 day prior. EXAM: MRI ABDOMEN WITHOUT AND WITH CONTRAST (INCLUDING MRCP) TECHNIQUE: Multiplanar multisequence MR imaging of the abdomen was performed both before and after the administration of intravenous contrast. Heavily T2-weighted images of the biliary and pancreatic ducts were obtained, and three-dimensional MRCP images were rendered by post processing. CONTRAST:   58m GADAVIST GADOBUTROL 1 MMOL/ML IV SOLN COMPARISON:  None. FINDINGS: Lower chest:  Lung bases are clear. Hepatobiliary: No intrahepatic biliary duct dilatation. Common bile duct is normal caliber. No filling defects in the common bile duct. There multiple faceted gallstones within the lumen the gallbladder. No gallbladder inflammation identified. Small benign cyst in the lateral LEFT hepatic lobe. Pancreas: Pancreatic duct is normal caliber. Normal  pancreatic ductal anatomy. No pancreatic inflammation or fluid collections. Spleen: Normal spleen. Adrenals/urinary tract: Adrenal glands and kidneys are normal. Stomach/Bowel: Stomach and limited of the small bowel is unremarkable Vascular/Lymphatic: Abdominal aortic normal caliber. No retroperitoneal periportal lymphadenopathy. Musculoskeletal: No aggressive osseous lesion IMPRESSION: 1. Cholelithiasis without evidence cholecystitis. 2. No choledocholithiasis.  Normal biliary tree. 3. Normal pancreas. Electronically Signed   By: Suzy Bouchard M.D.   On: 05/03/2021 14:34   MR 3D Recon At Scanner  Result Date: 05/03/2021 CLINICAL DATA:  Cholelithiasis. Epigastric pain. Gallstones on ultrasound 1 day prior. EXAM: MRI ABDOMEN WITHOUT AND WITH CONTRAST (INCLUDING MRCP) TECHNIQUE: Multiplanar multisequence MR imaging of the abdomen was performed both before and after the administration of intravenous contrast. Heavily T2-weighted images of the biliary and pancreatic ducts were obtained, and three-dimensional MRCP images were rendered by post processing. CONTRAST:  23m GADAVIST GADOBUTROL 1 MMOL/ML IV SOLN COMPARISON:  None. FINDINGS: Lower chest:  Lung bases are clear. Hepatobiliary: No intrahepatic biliary duct dilatation. Common bile duct is normal caliber. No filling defects in the common bile duct. There multiple faceted gallstones within the lumen the gallbladder. No gallbladder inflammation identified. Small benign cyst in the lateral LEFT hepatic lobe.  Pancreas: Pancreatic duct is normal caliber. Normal pancreatic ductal anatomy. No pancreatic inflammation or fluid collections. Spleen: Normal spleen. Adrenals/urinary tract: Adrenal glands and kidneys are normal. Stomach/Bowel: Stomach and limited of the small bowel is unremarkable Vascular/Lymphatic: Abdominal aortic normal caliber. No retroperitoneal periportal lymphadenopathy. Musculoskeletal: No aggressive osseous lesion IMPRESSION: 1. Cholelithiasis without evidence cholecystitis. 2. No choledocholithiasis.  Normal biliary tree. 3. Normal pancreas. Electronically Signed   By: SSuzy BouchardM.D.   On: 05/03/2021 14:34   [place]domen Limited RUQ (LIVER/GB)  Result Date: 05/02/2021 CLINICAL DATA:  Epigastric pain. EXAM: ULTRASOUND ABDOMEN LIMITED RIGHT UPPER QUADRANT COMPARISON:  CT from the same day. FINDINGS: Gallbladder: Multiple layering gallstones. No definite gallbladder wall thickening visualized. The gallbladder wall measures 2.9 mm, upper limits of normal. No sonographic Murphy sign noted by sonographer. Common bile duct: Diameter: 2.7 mm Liver: No focal lesion identified. Within normal limits in parenchymal echogenicity. Portal vein is patent on color Doppler imaging with normal direction of blood flow towards the liver. Other: None. IMPRESSION: Cholelithiasis without sonographic evidence of acute cholecystitis. Normal appearance of the liver. Electronically Signed   By: DFidela SalisburyM.D.   On: 05/02/2021 14:30   CT Angio Chest/Abd/Pel for Dissection W and/or W/WO  Result Date: 05/02/2021 CLINICAL DATA:  Patient with pain between the scapulas in the lumbar area. EXAM: CT ANGIOGRAPHY CHEST, ABDOMEN AND PELVIS TECHNIQUE: Chest radiograph earlier same day. Multidetector CT imaging through the chest, abdomen and pelvis was performed using the standard protocol during bolus administration of intravenous contrast. Multiplanar reconstructed images and MIPs were obtained and reviewed to evaluate  the vascular anatomy. CONTRAST:  1012mOMNIPAQUE IOHEXOL 350 MG/ML SOLN COMPARISON:  None. FINDINGS: CTA CHEST FINDINGS Cardiovascular: Normal heart size. Trace fluid superior pericardial recess. Thoracic aortic vascular calcifications. Motion artifact limits evaluation of the aortic root. Noncontrast images through the chest demonstrate no high density within the thoracic aorta is to suggest intramural hematoma. Origin of the great vessels are patent. No evidence for thoracic aortic dissection. Mediastinum/Nodes: No enlarged axillary, mediastinal or hilar lymphadenopathy. Normal appearance of the esophagus. Lungs/Pleura: Central airways are patent. No large area pulmonary consolidation. No pleural effusion or pneumothorax. Musculoskeletal: Thoracic spine degenerative changes. No aggressive or acute appearing osseous lesions. Review of the MIP images confirms the  above findings. CTA ABDOMEN AND PELVIS FINDINGS VASCULAR Aorta: Normal caliber aorta without aneurysm, dissection, vasculitis or significant stenosis. Celiac: Patent without evidence of aneurysm, dissection, vasculitis or significant stenosis. SMA: Patent without evidence of aneurysm, dissection, vasculitis or significant stenosis. Renals: Both renal arteries are patent without evidence of aneurysm, dissection, vasculitis, fibromuscular dysplasia or significant stenosis. IMA: Patent without evidence of aneurysm, dissection, vasculitis or significant stenosis. Inflow: Patent without evidence of aneurysm, dissection, vasculitis or significant stenosis. Veins: No obvious venous abnormality within the limitations of this arterial phase study. Review of the MIP images confirms the above findings. NON-VASCULAR Hepatobiliary: Liver is normal in size and contour. Gallbladder is unremarkable. Subcentimeter too small to characterize low-attenuation lesion left hepatic lobe (image 81; series 5). No intrahepatic or extrahepatic biliary ductal dilatation. Pancreas:  Unremarkable Spleen: Unremarkable Adrenals/Urinary Tract: Normal adrenal glands. Kidneys enhance symmetrically with contrast. There 1.5 cm exophytic cyst mid pole left kidney. Urinary bladder is unremarkable. Stomach/Bowel: No abnormal bowel wall thickening or evidence for bowel obstruction. No free fluid or free intraperitoneal air. Normal morphology of the stomach. Lymphatic: No retroperitoneal lymphadenopathy. Reproductive: Heterogeneous prostate. Other: Small fat containing left inguinal hernia. Musculoskeletal: Lumbar spine degenerative changes. No aggressive or acute appearing osseous lesions. Review of the MIP images confirms the above findings. IMPRESSION: No CT evidence for thoracic or abdominal aortic dissection. No acute process identified within the chest, abdomen or pelvis. Electronically Signed   By: Lovey Newcomer M.D.   On: 05/02/2021 13:20   DG Chest 2 View  Result Date: 05/02/2021 CLINICAL DATA:  Posterior chest pain and shortness of breath for 5 hours. Nausea. EXAM: CHEST - 2 VIEW COMPARISON:  10/25/2011 FINDINGS: The heart size and mediastinal contours are within normal limits. Both lungs are clear. The visualized skeletal structures are unremarkable. IMPRESSION: No active cardiopulmonary disease. Electronically Signed   By: Marlaine Hind M.D.   On: 05/02/2021 10:34    Assessment & Plan:   Problem List Items Addressed This Visit     Actinic keratoses    R forearm x2 - more inflamed now See procedure Sun protection discussed      Lipoma of arm    New 2.5x2.0 cm R forearm distal radial aspect mobile oval lipoma (probable), NT [place] or MRI if growing, changing         No orders of the defined types were placed in this encounter.     Follow-up: No follow-ups on file.  Walker Kehr, MD

## 2022-09-02 ENCOUNTER — Other Ambulatory Visit: Payer: Self-pay | Admitting: Cardiology

## 2022-09-25 ENCOUNTER — Other Ambulatory Visit: Payer: Self-pay | Admitting: Cardiology

## 2022-10-02 ENCOUNTER — Other Ambulatory Visit: Payer: Self-pay | Admitting: Cardiology

## 2022-10-07 ENCOUNTER — Other Ambulatory Visit: Payer: Self-pay | Admitting: Internal Medicine

## 2022-10-26 ENCOUNTER — Other Ambulatory Visit: Payer: Self-pay | Admitting: Cardiology

## 2022-11-11 ENCOUNTER — Other Ambulatory Visit: Payer: Self-pay | Admitting: Cardiology

## 2022-11-29 ENCOUNTER — Other Ambulatory Visit: Payer: Self-pay | Admitting: Cardiology

## 2022-12-06 ENCOUNTER — Other Ambulatory Visit: Payer: Self-pay | Admitting: Internal Medicine

## 2022-12-13 ENCOUNTER — Telehealth: Payer: Self-pay | Admitting: Internal Medicine

## 2022-12-13 DIAGNOSIS — Z0000 Encounter for general adult medical examination without abnormal findings: Secondary | ICD-10-CM

## 2022-12-13 DIAGNOSIS — E785 Hyperlipidemia, unspecified: Secondary | ICD-10-CM

## 2022-12-13 DIAGNOSIS — N320 Bladder-neck obstruction: Secondary | ICD-10-CM

## 2022-12-13 NOTE — Telephone Encounter (Signed)
Okay.  Thanks.

## 2022-12-13 NOTE — Telephone Encounter (Signed)
Patient called requesting lab orders to be placed prior to physical on 12/28/22. Callback at [phone]

## 2022-12-13 NOTE — Telephone Encounter (Signed)
Pt has [employer]  for insurance.Marland KitchenJohny Williams

## 2022-12-14 NOTE — Telephone Encounter (Signed)
Notified pt MD ok labs prior to appt. Made lab appt for 12/21/22.Marland KitchenJohny Williams

## 2022-12-21 ENCOUNTER — Other Ambulatory Visit (INDEPENDENT_AMBULATORY_CARE_PROVIDER_SITE_OTHER): Payer: Medicare Other

## 2022-12-21 DIAGNOSIS — E785 Hyperlipidemia, unspecified: Secondary | ICD-10-CM | POA: Diagnosis not present

## 2022-12-21 DIAGNOSIS — Z0000 Encounter for general adult medical examination without abnormal findings: Secondary | ICD-10-CM

## 2022-12-21 DIAGNOSIS — N320 Bladder-neck obstruction: Secondary | ICD-10-CM

## 2022-12-21 LAB — COMPREHENSIVE METABOLIC PANEL
ALT: 25 U/L (ref 0–53)
AST: 23 U/L (ref 0–37)
Albumin: 4.6 g/dL (ref 3.5–5.2)
Alkaline Phosphatase: 33 U/L — ABNORMAL LOW (ref 39–117)
BUN: 18 mg/dL (ref 6–23)
CO2: 28 mEq/L (ref 19–32)
Calcium: 9.5 mg/dL (ref 8.4–10.5)
Chloride: 103 mEq/L (ref 96–112)
Creatinine, Ser: 1.02 mg/dL (ref 0.40–1.50)
GFR: 76.1 mL/min (ref >60.00)
Glucose, Bld: 116 mg/dL — ABNORMAL HIGH (ref 70–99)
Potassium: 4.7 mEq/L (ref 3.5–5.1)
Sodium: 139 mEq/L (ref 135–145)
Total Bilirubin: 0.8 mg/dL (ref 0.2–1.2)
Total Protein: 6.7 g/dL (ref 6.0–8.3)

## 2022-12-21 LAB — LIPID PANEL
Cholesterol: 109 mg/dL (ref 0–200)
HDL: 44.3 mg/dL (ref >39.00)
LDL Cholesterol: 46 mg/dL (ref 0–99)
NonHDL: 64.98
Total CHOL/HDL Ratio: 2
Triglycerides: 95 mg/dL (ref 0.0–149.0)
VLDL: 19 mg/dL (ref 0.0–40.0)

## 2022-12-21 LAB — CBC WITH DIFFERENTIAL/PLATELET
Basophils Absolute: 0.1 10*3/uL (ref 0.0–0.1)
Basophils Relative: 1.4 % (ref 0.0–3.0)
Eosinophils Absolute: 0.2 10*3/uL (ref 0.0–0.7)
Eosinophils Relative: 3.8 % (ref 0.0–5.0)
HCT: 45 % (ref 39.0–52.0)
Hemoglobin: 15.2 g/dL (ref 13.0–17.0)
Lymphocytes Relative: 32.5 % (ref 12.0–46.0)
Lymphs Abs: 1.6 10*3/uL (ref 0.7–4.0)
MCHC: 33.7 g/dL (ref 30.0–36.0)
MCV: 88 fl (ref 78.0–100.0)
Monocytes Absolute: 0.3 10*3/uL (ref 0.1–1.0)
Monocytes Relative: 6.7 % (ref 3.0–12.0)
Neutro Abs: 2.7 10*3/uL (ref 1.4–7.7)
Neutrophils Relative %: 55.6 % (ref 43.0–77.0)
Platelets: 208 10*3/uL (ref 150.0–400.0)
RBC: 5.12 Mil/uL (ref 4.22–5.81)
RDW: 13.5 % (ref 11.5–15.5)
WBC: 4.8 10*3/uL (ref 4.0–10.5)

## 2022-12-21 LAB — URINALYSIS
Bilirubin Urine: NEGATIVE
Hgb urine dipstick: NEGATIVE
Ketones, ur: NEGATIVE
Leukocytes,Ua: NEGATIVE
Nitrite: NEGATIVE
Specific Gravity, Urine: 1.025 (ref 1.000–1.030)
Total Protein, Urine: NEGATIVE
Urine Glucose: NEGATIVE
Urobilinogen, UA: 0.2 (ref 0.0–1.0)
pH: 5.5 (ref 5.0–8.0)

## 2022-12-21 LAB — TSH: TSH: 1.57 u[IU]/mL (ref 0.35–5.50)

## 2022-12-21 LAB — PSA: PSA: 1.91 ng/mL (ref 0.10–4.00)

## 2022-12-25 ENCOUNTER — Other Ambulatory Visit: Payer: Self-pay | Admitting: Internal Medicine

## 2022-12-28 ENCOUNTER — Telehealth: Payer: Self-pay | Admitting: Internal Medicine

## 2022-12-28 ENCOUNTER — Encounter: Payer: Self-pay | Admitting: Internal Medicine

## 2022-12-28 ENCOUNTER — Ambulatory Visit (INDEPENDENT_AMBULATORY_CARE_PROVIDER_SITE_OTHER): Payer: Medicare Other | Admitting: Internal Medicine

## 2022-12-28 VITALS — BP 128/70 | HR 70 | Temp 98.2°F | Ht 72.0 in | Wt 253.0 lb

## 2022-12-28 DIAGNOSIS — R1013 Epigastric pain: Secondary | ICD-10-CM | POA: Diagnosis not present

## 2022-12-28 DIAGNOSIS — K8021 Calculus of gallbladder without cholecystitis with obstruction: Secondary | ICD-10-CM | POA: Diagnosis not present

## 2022-12-28 DIAGNOSIS — Z0000 Encounter for general adult medical examination without abnormal findings: Secondary | ICD-10-CM | POA: Diagnosis not present

## 2022-12-28 MED ORDER — ONDANSETRON HCL 4 MG PO TABS: 4.0000 mg | ORAL_TABLET | Freq: Three times a day (TID) | ORAL | 0 refills | Status: DC | PRN

## 2022-12-28 MED ORDER — OXYCODONE HCL 5 MG PO TABS: 5.0000 mg | ORAL_TABLET | ORAL | 0 refills | Status: DC | PRN

## 2022-12-28 NOTE — Patient Instructions (Signed)
Go to ER if sick

## 2022-12-28 NOTE — Assessment & Plan Note (Addendum)
Repeat mid-abd pain attacks of ?etiology  Cholelithiasis on ultrasound.  His illness could be a biliary colic related to a passed gallstone.  His alkaline phosphatase was never too high, however. No alcohol, no Tylenol.  He will need a surgical consultation to address his cholelithiasis.  Repeat liver function tests in 2 months   MRCP IMPRESSION: 1. Cholelithiasis without evidence cholecystitis. 2. No choledocholithiasis.  Normal biliary tree. 3. Normal pancreas.

## 2022-12-28 NOTE — Telephone Encounter (Signed)
Received urgent referral from PCP for abd pain, nausea, vomiting and pain radiating into the back between his shoulder blades. Please advise urgency and scheduling?  Thanks

## 2022-12-28 NOTE — Telephone Encounter (Signed)
Pt contacted upon urgent referral.  Pt stated that she is a lot better today than yesterday. Pt stated that he took a pain pill, and nausea pill and got three hours of sleep and feels much better now. Pt stated that he had similar symptoms 1 year ago.  Pt stated that he is not having any pain nor diarrhea now. Pt scheduled for an office visit with Carl Best NP 01/05/2023 at 1:30 PM.  Pt made aware. Pt verbalized understanding with all questions answered.

## 2022-12-28 NOTE — Assessment & Plan Note (Signed)

## 2022-12-28 NOTE — Progress Notes (Signed)
Subjective:  Patient ID: Joe Williams, male    DOB: 1956-08-12  Age: 67 y.o. MRN: [id number]  CC: No chief complaint on file.   HPI do you feel like you are having hungry this morning Joe Williams presents for a well exam  He had an episode yesterday of mid-abd pain, protrusion, n/v x3 from lunch till 2 am. Pain was 10/10. He is feeling normal now. There was a bulge in the middle.  Previous tests:  Cholelithiasis on ultrasound.  His illness could be a biliary colic related to a passed gallstone.  His alkaline phosphatase was never too high, however. No alcohol, no Tylenol.  He will need a surgical consultation to address his cholelithiasis.  Repeat liver function tests in 2 months   MRCP IMPRESSION: 1. Cholelithiasis without evidence cholecystitis. 2. No choledocholithiasis.  Normal biliary tree. 3. Normal pancreas.   Outpatient Medications Prior to Visit  Medication Sig Dispense Refill   aspirin EC 81 MG tablet Take 81 mg by mouth daily.     cholecalciferol (VITAMIN D) 1000 UNITS tablet Take 1,000 Units by mouth daily.     meloxicam (MOBIC) 15 MG tablet TAKE 1 TABLET BY MOUTH DAILY. ANNUAL APPT DUE IN FEB MUST SEE PROVIDER FOR FUTURE REFILLS 30 tablet 1   Menthol, Topical Analgesic, 4 % GEL Apply 1 application topically as needed. To knees     omeprazole (PRILOSEC) 40 MG capsule TAKE 1 CAPSULE (40 MG TOTAL) BY MOUTH DAILY. 90 capsule 2   potassium gluconate 595 (99 K) MG TABS tablet Take 1 tablet by mouth daily.     rosuvastatin (CRESTOR) 10 MG tablet Take 1 tablet (10 mg total) by mouth daily. 90 tablet 0   tamsulosin (FLOMAX) 0.4 MG CAPS capsule TAKE 1 CAPSULE BY MOUTH EVERY DAY 90 capsule 3   vitamin C (ASCORBIC ACID) 500 MG tablet Take 500 mg by mouth daily.     No facility-administered medications prior to visit.    ROS: Review of Systems  Constitutional:  Negative for appetite change, fatigue and unexpected weight change.  HENT:  Negative for congestion,  nosebleeds, sneezing, sore throat and trouble swallowing.   Eyes:  Negative for itching and visual disturbance.  Respiratory:  Negative for cough.   Cardiovascular:  Negative for chest pain, palpitations and leg swelling.  Gastrointestinal:  Positive for abdominal pain and nausea. Negative for abdominal distention, blood in stool and diarrhea.  Genitourinary:  Negative for frequency and hematuria.  Musculoskeletal:  Negative for back pain, gait problem, joint swelling and neck pain.  Skin:  Negative for rash.  Neurological:  Negative for dizziness, tremors, speech difficulty and weakness.  Psychiatric/Behavioral:  Negative for agitation, dysphoric mood and sleep disturbance. The patient is not nervous/anxious.     Objective:  BP 128/70 (BP Location: Right Arm, Patient Position: Sitting, Cuff Size: Normal)   Pulse 70   Temp 98.2 F (36.8 C) (Oral)   Ht 6' (1.829 m)   Wt 253 lb (114.8 kg)   SpO2 96%   BMI 34.31 kg/m   BP Readings from Last 3 Encounters:  12/28/22 128/70  06/01/22 128/72  12/27/21 122/78    Wt Readings from Last 3 Encounters:  12/28/22 253 lb (114.8 kg)  06/01/22 253 lb (114.8 kg)  12/27/21 248 lb (112.5 kg)    Physical Exam Constitutional:      General: He is not in acute distress.    Appearance: He is well-developed.     Comments: NAD  Eyes:     Conjunctiva/sclera: Conjunctivae normal.     Pupils: Pupils are equal, round, and reactive to light.  Neck:     Thyroid: No thyromegaly.     Vascular: No JVD.  Cardiovascular:     Rate and Rhythm: Normal rate and regular rhythm.     Heart sounds: Normal heart sounds. No murmur heard.    No friction rub. No gallop.  Pulmonary:     Effort: Pulmonary effort is normal. No respiratory distress.     Breath sounds: Normal breath sounds. No wheezing or rales.  Chest:     Chest wall: No tenderness.  Abdominal:     General: Bowel sounds are normal. There is no distension.     Palpations: Abdomen is soft. There is  no mass.     Tenderness: There is no abdominal tenderness. There is no guarding or rebound.  Musculoskeletal:        General: No tenderness. Normal range of motion.     Cervical back: Normal range of motion.  Lymphadenopathy:     Cervical: No cervical adenopathy.  Skin:    General: Skin is warm and dry.     Findings: No rash.  Neurological:     Mental Status: He is alert and oriented to person, place, and time.     Cranial Nerves: No cranial nerve deficit.     Motor: No abnormal muscle tone.     Coordination: Coordination normal.     Gait: Gait normal.     Deep Tendon Reflexes: Reflexes are normal and symmetric.  Psychiatric:        Behavior: Behavior normal.        Thought Content: Thought content normal.        Judgment: Judgment normal.    I spent 22 minutes in addition to time for CPX wellness examination in preparing to see the patient by review of recent labs, imaging and procedures, obtaining and reviewing separately obtained history, communicating with the patient, ordering medications, tests or procedures, and documenting clinical information in the EHR including the differential diagnosis, treatment, and any further evaluation and other management of abd pain, n/v         Lab Results  Component Value Date   WBC 4.8 12/21/2022   HGB 15.2 12/21/2022   HCT 45.0 12/21/2022   PLT 208.0 12/21/2022   GLUCOSE 116 (H) 12/21/2022   CHOL 109 12/21/2022   TRIG 95.0 12/21/2022   HDL 44.30 12/21/2022   LDLCALC 46 12/21/2022   ALT 25 12/21/2022   AST 23 12/21/2022   NA 139 12/21/2022   K 4.7 12/21/2022   CL 103 12/21/2022   CREATININE 1.02 12/21/2022   BUN 18 12/21/2022   CO2 28 12/21/2022   TSH 1.57 12/21/2022   PSA 1.91 12/21/2022   INR 1.2 05/03/2021   HGBA1C 5.9 12/24/2021    MR ABDOMEN MRCP W WO CONTAST  Result Date: 05/03/2021 CLINICAL DATA:  Cholelithiasis. Epigastric pain. Gallstones on ultrasound 1 day prior. EXAM: MRI ABDOMEN WITHOUT AND WITH CONTRAST  (INCLUDING MRCP) TECHNIQUE: Multiplanar multisequence MR imaging of the abdomen was performed both before and after the administration of intravenous contrast. Heavily T2-weighted images of the biliary and pancreatic ducts were obtained, and three-dimensional MRCP images were rendered by post processing. CONTRAST:  52m GADAVIST GADOBUTROL 1 MMOL/ML IV SOLN COMPARISON:  None. FINDINGS: Lower chest:  Lung bases are clear. Hepatobiliary: No intrahepatic biliary duct dilatation. Common bile duct is normal caliber. No filling defects in  the common bile duct. There multiple faceted gallstones within the lumen the gallbladder. No gallbladder inflammation identified. Small benign cyst in the lateral LEFT hepatic lobe. Pancreas: Pancreatic duct is normal caliber. Normal pancreatic ductal anatomy. No pancreatic inflammation or fluid collections. Spleen: Normal spleen. Adrenals/urinary tract: Adrenal glands and kidneys are normal. Stomach/Bowel: Stomach and limited of the small bowel is unremarkable Vascular/Lymphatic: Abdominal aortic normal caliber. No retroperitoneal periportal lymphadenopathy. Musculoskeletal: No aggressive osseous lesion IMPRESSION: 1. Cholelithiasis without evidence cholecystitis. 2. No choledocholithiasis.  Normal biliary tree. 3. Normal pancreas. Electronically Signed   By: Suzy Bouchard M.D.   On: 05/03/2021 14:34   MR 3D Recon At Scanner  Result Date: 05/03/2021 CLINICAL DATA:  Cholelithiasis. Epigastric pain. Gallstones on ultrasound 1 day prior. EXAM: MRI ABDOMEN WITHOUT AND WITH CONTRAST (INCLUDING MRCP) TECHNIQUE: Multiplanar multisequence MR imaging of the abdomen was performed both before and after the administration of intravenous contrast. Heavily T2-weighted images of the biliary and pancreatic ducts were obtained, and three-dimensional MRCP images were rendered by post processing. CONTRAST:  49m GADAVIST GADOBUTROL 1 MMOL/ML IV SOLN COMPARISON:  None. FINDINGS: Lower chest:  Lung  bases are clear. Hepatobiliary: No intrahepatic biliary duct dilatation. Common bile duct is normal caliber. No filling defects in the common bile duct. There multiple faceted gallstones within the lumen the gallbladder. No gallbladder inflammation identified. Small benign cyst in the lateral LEFT hepatic lobe. Pancreas: Pancreatic duct is normal caliber. Normal pancreatic ductal anatomy. No pancreatic inflammation or fluid collections. Spleen: Normal spleen. Adrenals/urinary tract: Adrenal glands and kidneys are normal. Stomach/Bowel: Stomach and limited of the small bowel is unremarkable Vascular/Lymphatic: Abdominal aortic normal caliber. No retroperitoneal periportal lymphadenopathy. Musculoskeletal: No aggressive osseous lesion IMPRESSION: 1. Cholelithiasis without evidence cholecystitis. 2. No choledocholithiasis.  Normal biliary tree. 3. Normal pancreas. Electronically Signed   By: SSuzy BouchardM.D.   On: 05/03/2021 14:34   [place]domen Limited RUQ (LIVER/GB)  Result Date: 05/02/2021 CLINICAL DATA:  Epigastric pain. EXAM: ULTRASOUND ABDOMEN LIMITED RIGHT UPPER QUADRANT COMPARISON:  CT from the same day. FINDINGS: Gallbladder: Multiple layering gallstones. No definite gallbladder wall thickening visualized. The gallbladder wall measures 2.9 mm, upper limits of normal. No sonographic Murphy sign noted by sonographer. Common bile duct: Diameter: 2.7 mm Liver: No focal lesion identified. Within normal limits in parenchymal echogenicity. Portal vein is patent on color Doppler imaging with normal direction of blood flow towards the liver. Other: None. IMPRESSION: Cholelithiasis without sonographic evidence of acute cholecystitis. Normal appearance of the liver. Electronically Signed   By: DFidela SalisburyM.D.   On: 05/02/2021 14:30   CT Angio Chest/Abd/Pel for Dissection W and/or W/WO  Result Date: 05/02/2021 CLINICAL DATA:  Patient with pain between the scapulas in the lumbar area. EXAM: CT  ANGIOGRAPHY CHEST, ABDOMEN AND PELVIS TECHNIQUE: Chest radiograph earlier same day. Multidetector CT imaging through the chest, abdomen and pelvis was performed using the standard protocol during bolus administration of intravenous contrast. Multiplanar reconstructed images and MIPs were obtained and reviewed to evaluate the vascular anatomy. CONTRAST:  1026mOMNIPAQUE IOHEXOL 350 MG/ML SOLN COMPARISON:  None. FINDINGS: CTA CHEST FINDINGS Cardiovascular: Normal heart size. Trace fluid superior pericardial recess. Thoracic aortic vascular calcifications. Motion artifact limits evaluation of the aortic root. Noncontrast images through the chest demonstrate no high density within the thoracic aorta is to suggest intramural hematoma. Origin of the great vessels are patent. No evidence for thoracic aortic dissection. Mediastinum/Nodes: No enlarged axillary, mediastinal or hilar lymphadenopathy. Normal appearance of the esophagus. Lungs/Pleura:  Central airways are patent. No large area pulmonary consolidation. No pleural effusion or pneumothorax. Musculoskeletal: Thoracic spine degenerative changes. No aggressive or acute appearing osseous lesions. Review of the MIP images confirms the above findings. CTA ABDOMEN AND PELVIS FINDINGS VASCULAR Aorta: Normal caliber aorta without aneurysm, dissection, vasculitis or significant stenosis. Celiac: Patent without evidence of aneurysm, dissection, vasculitis or significant stenosis. SMA: Patent without evidence of aneurysm, dissection, vasculitis or significant stenosis. Renals: Both renal arteries are patent without evidence of aneurysm, dissection, vasculitis, fibromuscular dysplasia or significant stenosis. IMA: Patent without evidence of aneurysm, dissection, vasculitis or significant stenosis. Inflow: Patent without evidence of aneurysm, dissection, vasculitis or significant stenosis. Veins: No obvious venous abnormality within the limitations of this arterial phase study.  Review of the MIP images confirms the above findings. NON-VASCULAR Hepatobiliary: Liver is normal in size and contour. Gallbladder is unremarkable. Subcentimeter too small to characterize low-attenuation lesion left hepatic lobe (image 81; series 5). No intrahepatic or extrahepatic biliary ductal dilatation. Pancreas: Unremarkable Spleen: Unremarkable Adrenals/Urinary Tract: Normal adrenal glands. Kidneys enhance symmetrically with contrast. There 1.5 cm exophytic cyst mid pole left kidney. Urinary bladder is unremarkable. Stomach/Bowel: No abnormal bowel wall thickening or evidence for bowel obstruction. No free fluid or free intraperitoneal air. Normal morphology of the stomach. Lymphatic: No retroperitoneal lymphadenopathy. Reproductive: Heterogeneous prostate. Other: Small fat containing left inguinal hernia. Musculoskeletal: Lumbar spine degenerative changes. No aggressive or acute appearing osseous lesions. Review of the MIP images confirms the above findings. IMPRESSION: No CT evidence for thoracic or abdominal aortic dissection. No acute process identified within the chest, abdomen or pelvis. Electronically Signed   By: Lovey Newcomer M.D.   On: 05/02/2021 13:20   DG Chest 2 View  Result Date: 05/02/2021 CLINICAL DATA:  Posterior chest pain and shortness of breath for 5 hours. Nausea. EXAM: CHEST - 2 VIEW COMPARISON:  10/25/2011 FINDINGS: The heart size and mediastinal contours are within normal limits. Both lungs are clear. The visualized skeletal structures are unremarkable. IMPRESSION: No active cardiopulmonary disease. Electronically Signed   By: Marlaine Hind M.D.   On: 05/02/2021 10:34    Assessment & Plan:   Problem List Items Addressed This Visit       Digestive   Cholelithiasis - Primary    Repeat mid-abd pain attacks of ?etiology  Cholelithiasis on ultrasound.  His illness could be a biliary colic related to a passed gallstone.  His alkaline phosphatase was never too high, however. No  alcohol, no Tylenol.  He will need a surgical consultation to address his cholelithiasis.  Repeat liver function tests in 2 months   MRCP IMPRESSION: 1. Cholelithiasis without evidence cholecystitis. 2. No choledocholithiasis.  Normal biliary tree. 3. Normal pancreas.        Other   Well adult exam     We discussed age appropriate health related issues, including available/recomended screening tests and vaccinations. Labs were ordered to be later reviewed . All questions were answered. We discussed one or more of the following - seat belt use, use of sunscreen/sun exposure exercise, fall risk reduction, second hand smoke exposure, firearm use and storage, seat belt use, a need for adhering to healthy diet and exercise. Labs were ordered.  All questions were answered.       Abdominal pain    Ref  to Dr Carlean Purl.  Repeat mid-abd pain attacks of ?etiology. Oxycodone and Zofran Rx prn.  <Cholelithiasis on ultrasound.  His illness could be a biliary colic related to a passed gallstone.  His alkaline phosphatase was never too high, however. No alcohol, no Tylenol.  He will need a surgical consultation to address his cholelithiasis.  Repeat liver function tests in 2 months   MRCP IMPRESSION: 1. Cholelithiasis without evidence cholecystitis. 2. No choledocholithiasis.  Normal biliary tree. 3. Normal pancreas.>       Relevant Orders   Ambulatory referral to Gastroenterology      Meds ordered this encounter  Medications   ondansetron (ZOFRAN) 4 MG tablet    Sig: Take 1 tablet (4 mg total) by mouth every 8 (eight) hours as needed for nausea or vomiting.    Dispense:  20 tablet    Refill:  0   oxyCODONE (ROXICODONE) 5 MG immediate release tablet    Sig: Take 1 tablet (5 mg total) by mouth every 4 (four) hours as needed for severe pain.    Dispense:  20 tablet    Refill:  0      Follow-up: Return in about 6 weeks (around 02/08/2023) for a follow-up visit.  Walker Kehr, MD

## 2022-12-28 NOTE — Assessment & Plan Note (Signed)
Ref  to Dr Carlean Purl.  Repeat mid-abd pain attacks of ?etiology. Oxycodone and Zofran Rx prn.  <Cholelithiasis on ultrasound.  His illness could be a biliary colic related to a passed gallstone.  His alkaline phosphatase was never too high, however. No alcohol, no Tylenol.  He will need a surgical consultation to address his cholelithiasis.  Repeat liver function tests in 2 months   MRCP IMPRESSION: 1. Cholelithiasis without evidence cholecystitis. 2. No choledocholithiasis.  Normal biliary tree. 3. Normal pancreas.>

## 2023-01-05 ENCOUNTER — Other Ambulatory Visit (INDEPENDENT_AMBULATORY_CARE_PROVIDER_SITE_OTHER): Payer: Medicare Other

## 2023-01-05 ENCOUNTER — Ambulatory Visit: Payer: Medicare Other | Admitting: Nurse Practitioner

## 2023-01-05 ENCOUNTER — Encounter: Payer: Self-pay | Admitting: Nurse Practitioner

## 2023-01-05 VITALS — BP 116/68 | HR 68 | Ht 72.0 in | Wt 252.5 lb

## 2023-01-05 DIAGNOSIS — R1013 Epigastric pain: Secondary | ICD-10-CM

## 2023-01-05 DIAGNOSIS — K8020 Calculus of gallbladder without cholecystitis without obstruction: Secondary | ICD-10-CM

## 2023-01-05 DIAGNOSIS — R112 Nausea with vomiting, unspecified: Secondary | ICD-10-CM

## 2023-01-05 LAB — HEPATIC FUNCTION PANEL
ALT: 79 U/L — ABNORMAL HIGH (ref 0–53)
AST: 28 U/L (ref 0–37)
Albumin: 4.5 g/dL (ref 3.5–5.2)
Alkaline Phosphatase: 59 U/L (ref 39–117)
Bilirubin, Direct: 0.3 mg/dL (ref 0.0–0.3)
Total Bilirubin: 0.6 mg/dL (ref 0.2–1.2)
Total Protein: 6.9 g/dL (ref 6.0–8.3)

## 2023-01-05 NOTE — Patient Instructions (Signed)
You have been scheduled for an abdominal ultrasound at [facility] (Entrance C) on 01/11/23 at 1:30 pm. Please arrive 30 minutes prior to your appointment for registration. Make certain not to have anything to eat or drink 8 hours prior to your appointment. Should you need to reschedule your appointment, please contact radiology at [phone]. This test typically takes about 30 minutes to perform.   Your provider has requested that you go to the basement level for lab work before leaving today. Press "B" on the elevator. The lab is located at the first door on the left as you exit the elevator.  Contact our office if your symptoms recur.  Avoid NSAIDs.  Due to recent changes in healthcare laws, you may see the results of your imaging and laboratory studies on MyChart before your provider has had a chance to review them.  We understand that in some cases there may be results that are confusing or concerning to you. Not all laboratory results come back in the same time frame and the provider may be waiting for multiple results in order to interpret others.  Please give [place] 48 hours in order for your provider to thoroughly review all the results before contacting the office for clarification of your results.    Thank you for trusting me with your gastrointestinal care!   Carl Best, CRNP

## 2023-01-05 NOTE — Progress Notes (Addendum)
01/05/2023 Joe Williams [id number] 10/17/1956   CHIEF COMPLAINT: Upper abdominal pain   HISTORY OF PRESENT ILLNESS: Joe Williams. Joe Williams is a 67 year old male with a past medical history of hyperlipidemia, obesity, GERD and colon polyps.  He is known by Dr. Carlean Purl.  He presents today as referred by Dr. Alain Marion for further evaluation regarding N/V and epigastric abdominal pain which radiates between the shoulder blades.  He developed abrupt epigastric pain which radiated to his RUQ and between the shoulder blades on 12/27/2022 which occurred after lunch. He had nausea and vomiting which lasted for a few hours. No hematemesis. He took a pain pill and the next day he felt much better, described feeling a general soreness throughout his abdomen.  He has a history of GERD for which he takes Omeprazole 40 mg once daily.  He gets breakthrough heartburn symptoms if he eats [place] food.  He develops acid reflux in his throat if he skips 2 consecutive days of Omeprazole. He takes ASA 81 mg daily and Meloxicam 15 mg once daily. He frequently takes Ibuprofen.  Denies ever having an EGD.    He reported having a similar attack of upper abdominal pain with N/V 04/2021.  At that time, he was seen in [facility] ED and a CTA negative for PE or aortic dissection.  No acute intra-abdominal/pelvic pathology was identified to explain his symptoms.  RUQ sonogram showed evidence of cholelithiasis without acute cholecystitis and no evidence of choledocholithiasis.  MRI/MRCP was also done which showed cholelithiasis without evidence of choledocholithiasis or biliary ductal dilatation.  His LFTs were elevated with a total bili 2 - 3.9.  Normal alk phos at 42.  AST 260.  ALT 179.  Acute hepatitis panel was negative.  Discharge home 05/03/2021.  His LFTs significantly improved by 05/10/2021, total bili 1.1.  Alk phos 61.  AST 34.  ALT 105.  He is passing a normal formed brown bowel movement daily.  No rectal bleeding or black  stools.  His most recent colonoscopy was 12/13/2016 and one 4 mm hyperplastic polyp was removed from the distal sigmoid colon.  Hemorrhoids were noted.  He was advised to repeat a colonoscopy in 10 years.  Past Medical History:  Diagnosis Date   COLONIC POLYPS, HX OF    rectal-hyperplastic only 2007   DYSFUNCTION, SINOATRIAL NODE    chronic sinus brady   GERD    GLUCOSE INTOLERANCE    History of shingles    HYPERLIPIDEMIA    Morbid obesity (HCC)    PLANTAR FASCIITIS    TOBACCO USE, QUIT    Past Surgical History:  Procedure Laterality Date   COLONOSCOPY     ROTATOR CUFF REPAIR  1995 (approx)   right -Dr. Mayer Camel   Social History:  reports that he has quit smoking. He has never used smokeless tobacco. He reports current alcohol use of about 3.0 standard drinks of alcohol per week. He reports that he does not use drugs.  Family History: family history includes Dementia in an other family member; Gallbladder disease in his mother; Heart disease in his mother; Ulcers in his father and sister.  Allergies  Allergen Reactions   Lovastatin     achy   Pravastatin     ??hepatitis   Tetracycline       Outpatient Encounter Medications as of 01/05/2023  Medication Sig   aspirin EC 81 MG tablet Take 81 mg by mouth daily.   cholecalciferol (VITAMIN D) 1000 UNITS tablet Take 1,000  Units by mouth daily.   meloxicam (MOBIC) 15 MG tablet TAKE 1 TABLET BY MOUTH DAILY. ANNUAL APPT DUE IN FEB MUST SEE PROVIDER FOR FUTURE REFILLS   Menthol, Topical Analgesic, 4 % GEL Apply 1 application topically as needed. To knees   omeprazole (PRILOSEC) 40 MG capsule TAKE 1 CAPSULE (40 MG TOTAL) BY MOUTH DAILY.   ondansetron (ZOFRAN) 4 MG tablet Take 1 tablet (4 mg total) by mouth every 8 (eight) hours as needed for nausea or vomiting.   oxyCODONE (ROXICODONE) 5 MG immediate release tablet Take 1 tablet (5 mg total) by mouth every 4 (four) hours as needed for severe pain.   potassium gluconate 595 (99 K) MG TABS  tablet Take 1 tablet by mouth daily.   rosuvastatin (CRESTOR) 10 MG tablet Take 1 tablet (10 mg total) by mouth daily.   tamsulosin (FLOMAX) 0.4 MG CAPS capsule TAKE 1 CAPSULE BY MOUTH EVERY DAY   vitamin C (ASCORBIC ACID) 500 MG tablet Take 500 mg by mouth daily.   No facility-administered encounter medications on file as of 01/05/2023.   REVIEW OF SYSTEMS:  Gen: Denies fever, sweats or chills. No weight loss.  CV: Denies chest pain, palpitations or edema. Resp: Denies cough, shortness of breath of hemoptysis.  GI: See HPI.  GU : Denies urinary burning, blood in urine, increased urinary frequency or incontinence. MS: + Back pain and arthritis. Derm: Denies rash, itchiness, skin lesions or unhealing ulcers. Psych: Denies depression, anxiety, memory loss or confusion. Heme: Denies bruising, easy bleeding. Neuro:  Denies headaches, dizziness or paresthesias. Endo:  Denies any problems with DM, thyroid or adrenal function.  PHYSICAL EXAM: BP 116/68 (BP Location: Left Arm, Patient Position: Sitting, Cuff Size: Normal)   Pulse 68   Ht 6' (1.829 m)   Wt 252 lb 8 oz (114.5 kg)   BMI 34.25 kg/m   General: 67 year old male in no acute distress. Head: Normocephalic and atraumatic. Eyes:  Sclerae non-icteric, conjunctive pink. Ears: Normal auditory acuity. Mouth: Dentition intact. No ulcers or lesions.  Neck: Supple, no lymphadenopathy or thyromegaly.  Lungs: Clear bilaterally to auscultation without wheezes, crackles or rhonchi. Heart: Regular rate and rhythm. No murmur, rub or gallop appreciated.  Abdomen: Soft, nontender, nondistended. No masses. No hepatosplenomegaly. Normoactive bowel sounds x 4 quadrants.  Rectal: Deferred. Musculoskeletal: Symmetrical with no gross deformities. Skin: Warm and dry. No rash or lesions on visible extremities. Extremities: No edema. Neurological: Alert oriented x 4, no focal deficits.  Psychological:  Alert and cooperative. Normal mood and  affect.  ASSESSMENT AND PLAN:  67 year old male with gallstones with N/V and epigastric pain which radiates to the mid back which initially occurred 04/2021 and a similar episode occurred 12/27/2022.  I suspect he had choledocholithiasis which spontaneously passed. -Hepatic panel -RUQ sonogram to reevaluate the gallbladder and CBD -Refer to general surgery to consider elective cholecystectomy -Patient instructed to go to the emergency room if he develops severe chest pain or upper back pain or upper abdominal pain  GERD, epigastric pain.  On Omeprazole for more than 5 years. Chronic NSAID use.  -Consider EGD prior to pursuing cholecystectomy, to discuss further with Dr. Carlean Purl -Continue omeprazole 40 mg once daily  History of a hyperplastic colon polyp per colonoscopy in 11/2016 -Next colonoscopy due 11/2026    CC:  Plotnikov, Evie Lacks, MD

## 2023-01-11 ENCOUNTER — Ambulatory Visit
Admission: RE | Admit: 2023-01-11 | Discharge: 2023-01-11 | Disposition: A | Discharge status: Home / Self Care | Payer: Medicare Other | Source: Ambulatory Visit | Attending: Nurse Practitioner | Admitting: Nurse Practitioner

## 2023-01-11 ENCOUNTER — Other Ambulatory Visit (HOSPITAL_COMMUNITY): Payer: Medicare Other

## 2023-01-11 DIAGNOSIS — R112 Nausea with vomiting, unspecified: Secondary | ICD-10-CM

## 2023-01-11 DIAGNOSIS — R1013 Epigastric pain: Secondary | ICD-10-CM

## 2023-01-11 DIAGNOSIS — K8020 Calculus of gallbladder without cholecystitis without obstruction: Secondary | ICD-10-CM | POA: Insufficient documentation

## 2023-01-29 ENCOUNTER — Other Ambulatory Visit: Payer: Self-pay | Admitting: Internal Medicine

## 2023-02-01 ENCOUNTER — Ambulatory Visit: Payer: Self-pay | Admitting: Surgery

## 2023-02-01 NOTE — H&P (Addendum)
History of Present Illness: Joe Williams is a 67 y.o. male who is seen today as an office consultation for evaluation of New Patient (Abdominal pain gallstones )   He has had several episodes of epigastric abdominal pain radiating to the back. He was admitted in [place] in 2022 after an attack, and at the time had elevated LFTs (Tbili up to 3.9), which normalized prior to discharge. He had an [place] and MRCP during that time, which showed gallstones but no common bile duct abnormalities. His acute hepatitis panel was negative. He more recently had a similar episode of pain and was referred to GI in February of 2022. He had a RUQ [place] which again showed cholelithiasis without signs of acute cholecystitis. LFTs were normal, aside from a very mildly elevated ALT of 79. He has been referred to discuss cholecystectomy.   During both of these episodes, he says the pain was extremely severe and was associated with nausea and vomiting. It lasted for about a day each time.    He has not had any prior abdominal surgeries. He had a myocardial perfusion stress test in 2020, which showed an EF of 55-65% and no ischemic changes. He takes aspirin 81mg  daily and no other blood thinners.      Review of Systems: A complete review of systems was obtained from the patient.  I have reviewed this information and discussed as appropriate with the patient.  See HPI as well for other ROS.     Medical History: Past Medical History Past Medical History: Diagnosis Date  Arthritis    GERD (gastroesophageal reflux disease)    Hyperlipidemia        There is no problem list on file for this patient.     Past Surgical History Past Surgical History: Procedure Laterality Date  right shoulder   06/1994      Allergies Allergies Allergen Reactions  Lovastatin Other (See Comments)     achy  Pravastatin Other (See Comments)     ??hepatitis  Tetracycline Other (See Comments)      Current Outpatient Medications on  File Prior to Visit Medication Sig Dispense Refill  meloxicam (MOBIC) 15 MG tablet TAKE 1 TABLET BY MOUTH DAILY. ANNUAL APPT DUE IN FEB MUST SEE PROVIDER FOR FUTURE REFILLS      omeprazole (PRILOSEC) 40 MG DR capsule Take by mouth      ondansetron (ZOFRAN) 4 MG tablet Take 1 tablet by mouth every 8 (eight) hours as needed      rosuvastatin (CRESTOR) 10 MG tablet Take 1 tablet by mouth once daily      tamsulosin (FLOMAX) 0.4 mg capsule Take 1 capsule by mouth once daily      ascorbic acid, vitamin C, (VITAMIN C) 500 MG tablet Take by mouth      aspirin 81 MG EC tablet Take 81 mg by mouth once daily      menthol 4 % Gel Apply topically       No current facility-administered medications on file prior to visit.     Family History Family History Problem Relation Age of Onset  Coronary Artery Disease (Blocked arteries around heart) Mother        Social History   Tobacco Use Smoking Status Former  Types: Cigarettes Smokeless Tobacco Never     Social History Social History    Socioeconomic History  Marital status: Married Tobacco Use  Smoking status: Former     Types: Cigarettes  Smokeless tobacco: Never Substance and Sexual Activity  Alcohol use: Yes  Drug use: Never      Objective:   Vitals:   02/01/23 1338 BP: 122/74 Pulse: 66 Temp: 36.9 C (98.5 F) SpO2: 96% Weight: (!) 114.5 kg (252 lb 6.4 oz) Height: 182.9 cm (6')   Body mass index is 34.23 kg/m.   Physical Exam Vitals reviewed.  Constitutional:      General: He is not in acute distress.    Appearance: Normal appearance.  HENT:     Head: Normocephalic and atraumatic.  Eyes:     General: No scleral icterus.    Conjunctiva/sclera: Conjunctivae normal.  Cardiovascular:     Rate and Rhythm: Normal rate and regular rhythm.  Pulmonary:     Effort: Pulmonary effort is normal. No respiratory distress.     Breath sounds: Normal breath sounds. No wheezing.  Abdominal:     General: There is no  distension.     Palpations: Abdomen is soft.     Tenderness: There is no abdominal tenderness.     Comments: No surgical scars.  Musculoskeletal:        General: No swelling or deformity. Normal range of motion.  Skin:    General: Skin is warm and dry.     Coloration: Skin is not jaundiced.  Neurological:     General: No focal deficit present.     Mental Status: He is alert and oriented to person, place, and time.  Psychiatric:        Mood and Affect: Mood normal.        Behavior: Behavior normal.       Assessment and Plan:    Diagnoses and all orders for this visit:   Calculus of gallbladder without cholecystitis without obstruction     This is a 67 yo male referred with episodic severe epigastric pain radiating to the back. I have personally reviewed his labs, imaging and referral notes. His first episode two years ago was associated with elevated LFTs. MRCP did not show any choledocholithiasis and acute hepatitis panel was negative. He has numerous stones within the gallbladder. I suspect based on his symptoms and LFT pattern that he passed a stone via the common bile duct. I recommended laparoscopic cholecystectomy . The details of this procedure were discussed with the patient, including the risks of bleeding, infection, bile leak, post-cholecystectomy diarrhea and <0.5% risk of common bile duct injury. The patient expressed understanding and agrees to proceed with surgery. He will be contacted to schedule an elective surgery date.  Patient does NOT need to hold his aspirin prior to surgery and I instructed him on this today.  Michaelle Birks, [facility], Hepatobiliary and Pancreatic Surgery 02/01/23 2:35 PM

## 2023-02-04 ENCOUNTER — Other Ambulatory Visit: Payer: Self-pay | Admitting: Internal Medicine

## 2023-02-16 ENCOUNTER — Ambulatory Visit: Payer: Medicare Other | Attending: Cardiology | Admitting: Cardiology

## 2023-02-16 ENCOUNTER — Encounter: Payer: Self-pay | Admitting: Cardiology

## 2023-02-16 VITALS — BP 121/76 | HR 72 | Ht 72.0 in | Wt 250.0 lb

## 2023-02-16 DIAGNOSIS — E785 Hyperlipidemia, unspecified: Secondary | ICD-10-CM

## 2023-02-16 DIAGNOSIS — R002 Palpitations: Secondary | ICD-10-CM

## 2023-02-16 DIAGNOSIS — I2510 Atherosclerotic heart disease of native coronary artery without angina pectoris: Secondary | ICD-10-CM | POA: Diagnosis not present

## 2023-02-16 DIAGNOSIS — R931 Abnormal findings on diagnostic imaging of heart and coronary circulation: Secondary | ICD-10-CM | POA: Insufficient documentation

## 2023-02-16 NOTE — Progress Notes (Signed)
Virtual Visit via Video Note   Because of Joe Williams's co-morbid illnesses, he is at least at moderate risk for complications without adequate follow up.  This format is felt to be most appropriate for this patient at this time.  All issues noted in this document were discussed and addressed.  A limited physical exam was performed with this format.  Please refer to the patient's chart for his consent to telehealth for [facility].   Date:  02/16/2023   ID:  Joe Williams, DOB Oct 13, 1956, MRN [id number] The patient was identified using 2 identifiers.  Patient Location: Home Provider Location: Home Office   PCP:  Plotnikov, Evie Lacks, MD   [place] Providers Cardiologist:  Fransico Him, MD     Evaluation Performed:  Follow-Up Visit  Chief Complaint:  CAD  History of Present Illness:    Joe Williams is a 67 y.o. male with a hx of HLD, tobacco use, glucose intolerance and family hx of CAD who underwent coronary calcium score to assess future risk for cardiovascular dz. His mom died of an MI in her mid 19's. Coronary calcium score was 242.   he is here today for followup and is doing well.  He denies any chest pain or pressure, SOB, DOE, PND, orthopnea, LE edema, dizziness, palpitations or syncope. He is compliant with his meds and is tolerating meds with no SE.    Past Medical History:  Diagnosis Date   Agatston coronary artery calcium score between 200 and 399    score 272 on CT   COLONIC POLYPS, HX OF    rectal-hyperplastic only 2007   DYSFUNCTION, SINOATRIAL NODE    chronic sinus brady   GERD    GLUCOSE INTOLERANCE    History of shingles    HYPERLIPIDEMIA    Morbid obesity    PLANTAR FASCIITIS    TOBACCO USE, QUIT    Past Surgical History:  Procedure Laterality Date   COLONOSCOPY     ROTATOR CUFF REPAIR  1995 (approx)   right -Dr. Mayer Camel     Current Meds  Medication Sig   aspirin EC 81 MG tablet Take 81 mg by mouth daily.    cholecalciferol (VITAMIN D) 1000 UNITS tablet Take 1,000 Units by mouth daily.   meloxicam (MOBIC) 15 MG tablet TAKE 1 TABLET BY MOUTH DAILY. ANNUAL APPT DUE IN FEB MUST SEE PROVIDER FOR FUTURE REFILLS   Menthol, Topical Analgesic, 4 % GEL Apply 1 application topically as needed. To knees   omeprazole (PRILOSEC) 40 MG capsule TAKE 1 CAPSULE (40 MG TOTAL) BY MOUTH DAILY.   ondansetron (ZOFRAN) 4 MG tablet TAKE 1 TABLET BY MOUTH EVERY 8 HOURS AS NEEDED FOR NAUSEA AND VOMITING   oxyCODONE (ROXICODONE) 5 MG immediate release tablet Take 1 tablet (5 mg total) by mouth every 4 (four) hours as needed for severe pain.   potassium gluconate 595 (99 K) MG TABS tablet Take 1 tablet by mouth daily.   rosuvastatin (CRESTOR) 10 MG tablet Take 1 tablet (10 mg total) by mouth daily.   tamsulosin (FLOMAX) 0.4 MG CAPS capsule TAKE 1 CAPSULE BY MOUTH EVERY DAY   vitamin C (ASCORBIC ACID) 500 MG tablet Take 500 mg by mouth daily.     Allergies:   Lovastatin, Pravastatin, and Tetracycline   Social History   Tobacco Use   Smoking status: Former   Smokeless tobacco: Never  Substance Use Topics   Alcohol use: Yes    Alcohol/week:  3.0 standard drinks of alcohol    Types: 3 Cans of beer per week    Comment: wknd   Drug use: No     Family Hx: The patient's family history includes Dementia in an other family member; Gallbladder disease in his mother; Heart disease in his mother; Ulcers in his father and sister. There is no history of Colon cancer.  ROS:   Please see the history of present illness.     All other systems reviewed and are negative.   Prior CV studies:   The following studies were reviewed today:  none  Labs/Other Tests and Data Reviewed:    EKG:  No ECG reviewed.  Recent Labs: 12/21/2022: BUN 18; Creatinine, Ser 1.02; Hemoglobin 15.2; Platelets 208.0; Potassium 4.7; Sodium 139; TSH 1.57 01/05/2023: ALT 79   Recent Lipid Panel Lab Results  Component Value Date/Time   CHOL 109  12/21/2022 07:46 AM   CHOL 118 10/20/2021 08:07 AM   TRIG 95.0 12/21/2022 07:46 AM   HDL 44.30 12/21/2022 07:46 AM   HDL 46 10/20/2021 08:07 AM   CHOLHDL 2 12/21/2022 07:46 AM   LDLCALC 46 12/21/2022 07:46 AM   LDLCALC 55 10/20/2021 08:07 AM    Wt Readings from Last 3 Encounters:  02/16/23 250 lb (113.4 kg)  01/05/23 252 lb 8 oz (114.5 kg)  12/28/22 253 lb (114.8 kg)     Risk Assessment/Calculations:          Objective:    Vital Signs:  BP 121/76   Pulse 72   Ht 6' (1.829 m)   Wt 250 lb (113.4 kg)   SpO2 95%   BMI 33.91 kg/m    VITAL SIGNS:  reviewed GEN:  no acute distress EYES:  sclerae anicteric, EOMI - Extraocular Movements Intact RESPIRATORY:  normal respiratory effort, symmetric expansion CARDIOVASCULAR:  no peripheral edema SKIN:  no rash, lesions or ulcers. MUSCULOSKELETAL:  no obvious deformities. NEURO:  alert and oriented x 3, no obvious focal deficit PSYCH:  normal affect  ASSESSMENT & PLAN:    1.  Coronary artery calcifications -noted on Chest CT at 272.   -lexiscan myoview 08/2019 with no ischemia -He has not had any chest pain or shortness of breath since I saw him last -Continue prescription drug management with aspirin 81 mg daily and Crestor 10 mg daily with as needed refills   2.  HLD -LDL goal < 70 -I have personally reviewed and interpreted outside labs performed by patient's PCP which showed LDL 46, triglycerides 95 and total cholesterol 109 on 12/21/2022.  ALT was mildly elevated in Feb due to recent cholecystitis and is having a lap chole recently.   -Continue Crestor 10 mg daily with as needed refills   3.  Palpitations -these occurred after the first COVID 19 vaccine and a few times after the second vaccine but since has subsided. -Event monitor was ordered but never done -He has had a few episodes of palpitation but all in the setting of cholecystitis>>he will let me now if he has further palpitation after his lap chole  Time:    Today, I have spent 15 minutes with the patient with telehealth technology discussing the above problems.     Medication Adjustments/Labs and Tests Ordered: Current medicines are reviewed at length with the patient today.  Concerns regarding medicines are outlined above.   Tests Ordered: No orders of the defined types were placed in this encounter.   Medication Changes: No orders of the defined types were  placed in this encounter.   Follow Up:  In Person in 1 year(s)  Signed, Fransico Him, MD  02/16/2023 8:17 AM

## 2023-02-16 NOTE — Patient Instructions (Signed)
Medication Instructions:  Your physician recommends that you continue on your current medications as directed. Please refer to the Current Medication list given to you today.  *If you need a refill on your cardiac medications before your next appointment, please call your pharmacy*   Lab Work: None.  If you have labs (blood work) drawn today and your tests are completely normal, you will receive your results only by: MyChart Message (if you have MyChart) OR A paper copy in the mail If you have any lab test that is abnormal or we need to change your treatment, we will call you to review the results.   Testing/Procedures: None.   Follow-Up:   Your next appointment:   1 year(s)  Provider:   Traci Turner, MD     

## 2023-03-03 ENCOUNTER — Other Ambulatory Visit: Payer: Self-pay | Admitting: Cardiology

## 2023-03-20 NOTE — Pre-Procedure Instructions (Signed)
Surgical Instructions    Your procedure is scheduled on Mar 27, 2023.  Report to [facility] Main Entrance "A" at 10:15 A.M., then check in with the Admitting office.  Call this number if you have problems the morning of surgery:  [phone]  If you have any questions prior to your surgery date call [phone]: Open Monday-Friday 8am-4pm If you experience any cold or flu symptoms such as cough, fever, chills, shortness of breath, etc. between now and your scheduled surgery, please notify us at the above number.     Remember:  Do not eat after midnight the night before your surgery  You may drink clear liquids until 9:15 AM the morning of your surgery.   Clear liquids allowed are: Water, Non-Citrus Juices (without pulp), Carbonated Beverages, Clear Tea, Black Coffee Only (NO MILK, CREAM OR POWDERED CREAMER of any kind), and Gatorade.     Take these medicines the morning of surgery with A SIP OF WATER:  omeprazole (PRILOSEC)   rosuvastatin (CRESTOR)   tamsulosin (FLOMAX)   ondansetron (ZOFRAN) - may take if needed  oxyCODONE (ROXICODONE) - may take if needed   Follow your surgeon's instructions on when to stop Aspirin.  If no instructions were given by your surgeon then you will need to call the office to get those instructions.     As of today, STOP taking any Aleve, Naproxen, Ibuprofen, Motrin, Advil, Goody's, BC's, all herbal medications, fish oil, and all vitamins. This includes your medication: meloxicam (MOBIC)                      Do NOT Smoke (Tobacco/Vaping) for 24 hours prior to your procedure.  If you use a CPAP at night, you may bring your mask/headgear for your overnight stay.   Contacts, glasses, piercing's, hearing aid's, dentures or partials may not be worn into surgery, please bring cases for these belongings.    For patients admitted to the hospital, discharge time will be determined by your treatment team.   Patients discharged the day of surgery will not  be allowed to drive home, and someone needs to stay with them for 24 hours.  SURGICAL WAITING ROOM VISITATION Patients having surgery or a procedure may have no more than 2 support people in the waiting area - these visitors may rotate.   Children under the age of 85 must have an adult with them who is not the patient. If the patient needs to stay at the hospital during part of their recovery, the visitor guidelines for inpatient rooms apply. Pre-op nurse will coordinate an appropriate time for 1 support person to accompany patient in pre-op.  This support person may not rotate.   Please refer to the [facility] website for the visitor guidelines for Inpatients (after your surgery is over and you are in a regular room).    Special instructions:   [place]- Preparing For Surgery  Before surgery, you can play an important role. Because skin is not sterile, your skin needs to be as free of germs as possible. You can reduce the number of germs on your skin by washing with CHG (chlorahexidine gluconate) Soap before surgery.  CHG is an antiseptic cleaner which kills germs and bonds with the skin to continue killing germs even after washing.    Oral Hygiene is also important to reduce your risk of infection.  Remember - BRUSH YOUR TEETH THE MORNING OF SURGERY WITH YOUR REGULAR TOOTHPASTE  Please do not use if  you have an allergy to CHG or antibacterial soaps. If your skin becomes reddened/irritated stop using the CHG.  Do not shave (including legs and underarms) for at least 48 hours prior to first CHG shower. It is OK to shave your face.  Please follow these instructions carefully.   Shower the NIGHT BEFORE SURGERY and the MORNING OF SURGERY  If you chose to wash your hair, wash your hair first as usual with your normal shampoo.  After you shampoo, rinse your hair and body thoroughly to remove the shampoo.  Use CHG Soap as you would any other liquid soap. You can apply CHG directly to the  skin and wash gently with a scrungie or a clean washcloth.   Apply the CHG Soap to your body ONLY FROM THE NECK DOWN.  Do not use on open wounds or open sores. Avoid contact with your eyes, ears, mouth and genitals (private parts). Wash Face and genitals (private parts)  with your normal soap.   Wash thoroughly, paying special attention to the area where your surgery will be performed.  Thoroughly rinse your body with warm water from the neck down.  DO NOT shower/wash with your normal soap after using and rinsing off the CHG Soap.  Pat yourself dry with a CLEAN TOWEL.  Wear CLEAN PAJAMAS to bed the night before surgery  Place CLEAN SHEETS on your bed the night before your surgery  DO NOT SLEEP WITH PETS.   Day of Surgery: Take a shower with CHG soap. Do not wear jewelry or makeup Do not wear lotions, powders, perfumes/colognes, or deodorant. Do not shave 48 hours prior to surgery.  Men may shave face and neck. Do not bring valuables to the hospital.  [employer] is not responsible for any belongings or valuables. Do not wear nail polish, gel polish, artificial nails, or any other type of covering on natural nails (fingers and toes) If you have artificial nails or gel coating that need to be removed by a nail salon, please have this removed prior to surgery. Artificial nails or gel coating may interfere with anesthesia's ability to adequately monitor your vital signs.  Wear Clean/Comfortable clothing the morning of surgery Remember to brush your teeth WITH YOUR REGULAR TOOTHPASTE.   Please read over the following fact sheets that you were given.    If you received a COVID test during your pre-op visit  it is requested that you wear a mask when out in public, stay away from anyone that may not be feeling well and notify your surgeon if you develop symptoms. If you have been in contact with anyone that has tested positive in the last 10 days please notify you surgeon.

## 2023-03-21 ENCOUNTER — Other Ambulatory Visit: Payer: Self-pay

## 2023-03-21 ENCOUNTER — Encounter (HOSPITAL_COMMUNITY)
Admission: RE | Admit: 2023-03-21 | Discharge: 2023-03-21 | Disposition: A | Discharge status: Home / Self Care | Payer: Medicare Other | Source: Ambulatory Visit | Attending: Surgery | Admitting: Surgery

## 2023-03-21 ENCOUNTER — Encounter (HOSPITAL_COMMUNITY): Payer: Self-pay

## 2023-03-21 VITALS — BP 130/86 | HR 61 | Temp 98.2°F | Resp 18 | Ht 71.0 in | Wt 254.8 lb

## 2023-03-21 DIAGNOSIS — Z01818 Encounter for other preprocedural examination: Secondary | ICD-10-CM | POA: Insufficient documentation

## 2023-03-21 DIAGNOSIS — E7439 Other disorders of intestinal carbohydrate absorption: Secondary | ICD-10-CM | POA: Diagnosis not present

## 2023-03-21 DIAGNOSIS — K219 Gastro-esophageal reflux disease without esophagitis: Secondary | ICD-10-CM | POA: Insufficient documentation

## 2023-03-21 DIAGNOSIS — R001 Bradycardia, unspecified: Secondary | ICD-10-CM | POA: Insufficient documentation

## 2023-03-21 DIAGNOSIS — E785 Hyperlipidemia, unspecified: Secondary | ICD-10-CM | POA: Insufficient documentation

## 2023-03-21 DIAGNOSIS — Z87891 Personal history of nicotine dependence: Secondary | ICD-10-CM | POA: Insufficient documentation

## 2023-03-21 DIAGNOSIS — I2510 Atherosclerotic heart disease of native coronary artery without angina pectoris: Secondary | ICD-10-CM | POA: Diagnosis not present

## 2023-03-21 DIAGNOSIS — K8020 Calculus of gallbladder without cholecystitis without obstruction: Secondary | ICD-10-CM | POA: Insufficient documentation

## 2023-03-21 LAB — CBC
HCT: 42.2 % (ref 39.0–52.0)
Hemoglobin: 14.1 g/dL (ref 13.0–17.0)
MCH: 29.2 pg (ref 26.0–34.0)
MCHC: 33.4 g/dL (ref 30.0–36.0)
MCV: 87.4 fL (ref 80.0–100.0)
Platelets: 171 10*3/uL (ref 150–400)
RBC: 4.83 MIL/uL (ref 4.22–5.81)
RDW: 13.2 % (ref 11.5–15.5)
WBC: 5.2 10*3/uL (ref 4.0–10.5)
nRBC: 0 % (ref 0.0–0.2)

## 2023-03-21 LAB — BASIC METABOLIC PANEL
Anion gap: 8 (ref 5–15)
BUN: 12 mg/dL (ref 8–23)
CO2: 26 mmol/L (ref 22–32)
Calcium: 8.9 mg/dL (ref 8.9–10.3)
Chloride: 104 mmol/L (ref 98–111)
Creatinine, Ser: 0.88 mg/dL (ref 0.61–1.24)
GFR, Estimated: >60 mL/min (ref >60)
Glucose, Bld: 121 mg/dL — ABNORMAL HIGH (ref 70–99)
Potassium: 4.4 mmol/L (ref 3.5–5.1)
Sodium: 138 mmol/L (ref 135–145)

## 2023-03-21 NOTE — Progress Notes (Signed)
PCP - Plotnikove, Aleksei Cardiologist - Armanda Magic  PPM/ICD - denies Device Orders - n/a Rep Notified - n/a  Chest x-ray - denies EKG - 03/21/2023 Stress Test - 08/20/2019 ECHO - 12/20/2012 Cardiac Cath - denies  Sleep Study - denies CPAP - denies  Fasting Blood Sugar - no DM   Last dose of GLP1 agonist-  n/a GLP1 instructions: n/a  Blood Thinner Instructions: yes  Aspirin Instructions: follow your surgeon's instructions on when to stop Aspirin.  If no instructions were given by your surgeon then you will need to call the office to get those instructions.     ERAS Protcol -yes PRE-SURGERY Ensure or G2- no  COVID TEST- no   Anesthesia review: yes (Coronary calcium levels were elevated in 2020)  Patient denies shortness of breath, fever, cough and chest pain at PAT appointment or the last 2 months.   All instructions explained to the patient, with a verbal understanding of the material. Patient agrees to go over the instructions while at home for a better understanding. Patient also instructed to self quarantine after being tested for COVID-19. The opportunity to ask questions was provided.

## 2023-03-22 NOTE — Anesthesia Preprocedure Evaluation (Addendum)
Anesthesia Evaluation  Patient identified by MRN, date of birth, ID band Patient awake    Reviewed: Allergy & Precautions, NPO status , Patient's Chart, lab work & pertinent test results  History of Anesthesia Complications Negative for: history of anesthetic complications  Airway Mallampati: III  TM Distance: >3 FB Neck ROM: Full    Dental  (+) Dental Advisory Given   Pulmonary neg shortness of breath, neg sleep apnea, neg COPD, neg recent URI, former smoker   Pulmonary exam normal breath sounds clear to auscultation       Cardiovascular (-) hypertension(-) angina + CAD  (-) Past MI, (-) Cardiac Stents and (-) CABG (-) dysrhythmias  Rhythm:Regular Rate:Normal  HLD  Normal stress test 08/20/2019  CT Cardiac Scoring 06/13/2019: IMPRESSION: Coronary calcium score of 242. This was 75th percentile for age and sex matched control.    Neuro/Psych  Headaches, neg Seizures    GI/Hepatic Neg liver ROS,GERD  Medicated,,cholelithiasis   Endo/Other  negative endocrine ROS    Renal/GU negative Renal ROS     Musculoskeletal  (+) Arthritis ,    Abdominal  (+) + obese  Peds  Hematology negative hematology ROS (+)   Anesthesia Other Findings   Reproductive/Obstetrics                             Anesthesia Physical Anesthesia Plan  ASA: 2  Anesthesia Plan: General   Post-op Pain Management:    Induction: Intravenous  PONV Risk Score and Plan: 2 and Ondansetron, Dexamethasone and Treatment may vary due to age or medical condition  Airway Management Planned: Oral ETT  Additional Equipment:   Intra-op Plan:   Post-operative Plan: Extubation in OR  Informed Consent: I have reviewed the patients History and Physical, chart, labs and discussed the procedure including the risks, benefits and alternatives for the proposed anesthesia with the patient or authorized representative who has indicated  his/her understanding and acceptance.     Dental advisory given  Plan Discussed with: Anesthesiologist and CRNA  Anesthesia Plan Comments: (PAT note written 03/22/2023 by Shonna Chock, PA-C.  Risks of general anesthesia discussed including, but not limited to, sore throat, hoarse voice, chipped/damaged teeth, injury to vocal cords, nausea and vomiting, allergic reactions, lung infection, heart attack, stroke, and death. All questions answered.   )       Anesthesia Quick Evaluation

## 2023-03-22 NOTE — Progress Notes (Signed)
Anesthesia Chart Review:   Case: [id number] Date/Time: 03/27/23 1200   Procedure: LAPAROSCOPIC CHOLECYSTECTOMY   Anesthesia type: General   Pre-op diagnosis: GALLSTONES   Location[facility] ROOM 06 / [facility] OR   Surgeons: Fritzi Mandes, MD       DISCUSSION: Patient is a 67 year old male scheduled for the above procedure.   History includes former smoker, sinus bradycardia, elevated Coronary Calcium Score (242, 75th percentile 06/13/19, non-ischemic stress test 08/20/19), HLD, glucose intolerance, GERD.  Last cardiology evaluation by Dr. Armanda Magic on 02/16/23. He was doing well from a cardiac standpoint. Continue ASA and statin. Had a few episodes of recurrent palpitations in the setting of cholecystitis, and advised to let her know if recurrence following cholecystectomy. One year follow-up is planned.   Anesthesia team to evaluate on the day of surgery.    VS: BP 130/86   Pulse 61   Temp 36.8 C   Resp 18   Ht 5\' 11"  (1.803 m)   Wt 115.6 kg   SpO2 99%   BMI 35.54 kg/m    PROVIDERS: Plotnikov, Georgina Quint, MD is PCP  Armanda Magic, MD is cardiologist Stan Head, MD is GI   LABS: Labs reviewed: Acceptable for surgery.  AST 28, ALT 79 01/05/23. A1c 5.9% 12/24/21. (all labs ordered are listed, but only abnormal results are displayed)  Labs Reviewed  BASIC METABOLIC PANEL - Abnormal; Notable for the following components:      Result Value   Glucose, Bld 121 (*)    All other components within normal limits  CBC     IMAGES: [place] Abd 01/11/23: IMPRESSION: 1. Cholelithiasis without secondary signs to suggest acute cholecystitis. 2. Increased hepatic parenchymal echogenicity suggestive of steatosis.    EKG: 03/21/23: Sinus bradycardia at 53 bpm Cannot rule out Anterior infarct , age undetermined Abnormal ECG When compared with ECG of 02-May-2021 09:32, PREVIOUS ECG IS PRESENT No significant change since last tracing Confirmed by Armanda Magic ([id number]) on 03/21/2023 7:54:03  PM   CV: Nuclear stress test 08/20/19: Nuclear stress EF: 64%. There was no ST segment deviation noted during stress. The study is normal. This is a low risk study. The left ventricular ejection fraction is normal (55-65%).   There were reduced counts in the inferior wall on rest imaging in the upright position that improved on stress imaging (especially on supine imaging) consistent with diaphragm attenuation.  This is a normal myocardial perfusion imaging study without evidence of ischemia.  This is a low risk study.   CT Cardiac Scoring 06/13/19: IMPRESSION: Coronary calcium score of 242. This was 75th percentile for age and sex matched control.    Echo 12/20/12: Study Conclusions  - Left ventricle: The cavity size was normal. Wall thickness    was increased in a pattern of mild LVH. Systolic function    was normal. The estimated ejection fraction was in the    range of 55% to 65%. Wall motion was normal; there were no    regional wall motion abnormalities. Left ventricular    diastolic function parameters were normal.  - Mitral valve: Mild regurgitation.  - Left atrium: The atrium was mildly dilated.  - Atrial septum: No defect or patent foramen ovale was    identified.  - Pulmonary arteries: PA peak pressure: 35mm Hg (S).    Past Medical History:  Diagnosis Date   Agatston coronary artery calcium score between 200 and 399    score 272 on CT   COLONIC  POLYPS, HX OF    rectal-hyperplastic only 2007   DYSFUNCTION, SINOATRIAL NODE    chronic sinus brady   GERD    GLUCOSE INTOLERANCE    History of shingles    HYPERLIPIDEMIA    Morbid obesity (HCC)    PLANTAR FASCIITIS    TOBACCO USE, QUIT     Past Surgical History:  Procedure Laterality Date   COLONOSCOPY     KNEE ARTHROSCOPY Bilateral 2019   ROTATOR CUFF REPAIR  1995 (approx)   right -Dr. Turner Daniels    MEDICATIONS:  aspirin EC 81 MG tablet   cholecalciferol (VITAMIN D) 1000 UNITS tablet   meloxicam (MOBIC) 15 MG  tablet   Menthol, Topical Analgesic, 4 % GEL   omeprazole (PRILOSEC) 40 MG capsule   ondansetron (ZOFRAN) 4 MG tablet   oxyCODONE (ROXICODONE) 5 MG immediate release tablet   potassium gluconate 595 (99 K) MG TABS tablet   rosuvastatin (CRESTOR) 10 MG tablet   tamsulosin (FLOMAX) 0.4 MG CAPS capsule   vitamin C (ASCORBIC ACID) 500 MG tablet   No current facility-administered medications for this encounter.  Advised to follow surgeon instructions regarding perioperative ASA.   Shonna Chock, PA-C Surgical Short Stay/Anesthesiology [facility] Phone [phone] [facility] Phone [phone] 03/22/2023 12:49 PM

## 2023-03-24 NOTE — Progress Notes (Signed)
Spoke with the pt, he will arrive on Mon at 0715. To stop clear liquids at 0615.

## 2023-03-27 ENCOUNTER — Encounter (HOSPITAL_COMMUNITY): Payer: Self-pay | Admitting: Surgery

## 2023-03-27 ENCOUNTER — Other Ambulatory Visit: Payer: Self-pay

## 2023-03-27 ENCOUNTER — Ambulatory Visit (HOSPITAL_COMMUNITY): Payer: Medicare Other | Admitting: Vascular Surgery

## 2023-03-27 ENCOUNTER — Encounter (HOSPITAL_COMMUNITY)
Admission: RE | Disposition: A | Discharge status: Home / Self Care | Payer: Self-pay | Source: Home / Self Care | Attending: Surgery

## 2023-03-27 ENCOUNTER — Ambulatory Visit (HOSPITAL_BASED_OUTPATIENT_CLINIC_OR_DEPARTMENT_OTHER): Payer: Medicare Other | Admitting: Anesthesiology

## 2023-03-27 ENCOUNTER — Ambulatory Visit (HOSPITAL_COMMUNITY)
Admission: RE | Admit: 2023-03-27 | Discharge: 2023-03-27 | Disposition: A | Discharge status: Home / Self Care | Payer: Medicare Other | Attending: Surgery | Admitting: Surgery

## 2023-03-27 DIAGNOSIS — K219 Gastro-esophageal reflux disease without esophagitis: Secondary | ICD-10-CM | POA: Insufficient documentation

## 2023-03-27 DIAGNOSIS — I2510 Atherosclerotic heart disease of native coronary artery without angina pectoris: Secondary | ICD-10-CM | POA: Diagnosis not present

## 2023-03-27 DIAGNOSIS — Z6833 Body mass index (BMI) 33.0-33.9, adult: Secondary | ICD-10-CM

## 2023-03-27 DIAGNOSIS — Z7982 Long term (current) use of aspirin: Secondary | ICD-10-CM | POA: Diagnosis not present

## 2023-03-27 DIAGNOSIS — M1990 Unspecified osteoarthritis, unspecified site: Secondary | ICD-10-CM | POA: Diagnosis not present

## 2023-03-27 DIAGNOSIS — E785 Hyperlipidemia, unspecified: Secondary | ICD-10-CM | POA: Diagnosis not present

## 2023-03-27 DIAGNOSIS — E669 Obesity, unspecified: Secondary | ICD-10-CM | POA: Diagnosis not present

## 2023-03-27 DIAGNOSIS — K8010 Calculus of gallbladder with chronic cholecystitis without obstruction: Secondary | ICD-10-CM | POA: Insufficient documentation

## 2023-03-27 DIAGNOSIS — Z87891 Personal history of nicotine dependence: Secondary | ICD-10-CM | POA: Insufficient documentation

## 2023-03-27 DIAGNOSIS — K8020 Calculus of gallbladder without cholecystitis without obstruction: Secondary | ICD-10-CM

## 2023-03-27 HISTORY — PX: CHOLECYSTECTOMY: SHX55

## 2023-03-27 SURGERY — LAPAROSCOPIC CHOLECYSTECTOMY
Anesthesia: General | Site: Abdomen

## 2023-03-27 MED ORDER — ONDANSETRON HCL 4 MG/2ML IJ SOLN
INTRAMUSCULAR | Status: DC | PRN
  Administered 2023-03-27: 4 mg via INTRAVENOUS

## 2023-03-27 MED ORDER — CHLORHEXIDINE GLUCONATE 0.12 % MT SOLN
15.0000 mL | Freq: Once | OROMUCOSAL | Status: AC
  Administered 2023-03-27: 15 mL via OROMUCOSAL
  Filled 2023-03-27: qty 15

## 2023-03-27 MED ORDER — MIDAZOLAM HCL 2 MG/2ML IJ SOLN
INTRAMUSCULAR | Status: DC | PRN
  Administered 2023-03-27: 2 mg via INTRAVENOUS

## 2023-03-27 MED ORDER — OXYCODONE HCL 5 MG/5ML PO SOLN: 5.0000 mg | Freq: Once | ORAL | Status: DC | PRN

## 2023-03-27 MED ORDER — MIDAZOLAM HCL 2 MG/2ML IJ SOLN
INTRAMUSCULAR | Status: AC
  Filled 2023-03-27: qty 2

## 2023-03-27 MED ORDER — BUPIVACAINE HCL (PF) 0.25 % IJ SOLN
INTRAMUSCULAR | Status: AC
  Filled 2023-03-27: qty 30

## 2023-03-27 MED ORDER — OXYCODONE HCL 5 MG PO TABS: 5.0000 mg | ORAL_TABLET | Freq: Once | ORAL | Status: DC | PRN

## 2023-03-27 MED ORDER — HYDROCODONE-ACETAMINOPHEN 5-325 MG PO TABS
1.0000 | ORAL_TABLET | Freq: Four times a day (QID) | ORAL | 0 refills | Status: AC | PRN
Start: 2023-03-27 — End: 2023-04-01

## 2023-03-27 MED ORDER — ORAL CARE MOUTH RINSE: 15.0000 mL | Freq: Once | OROMUCOSAL | Status: AC

## 2023-03-27 MED ORDER — FENTANYL CITRATE (PF) 100 MCG/2ML IJ SOLN: 25.0000 ug | INTRAMUSCULAR | Status: DC | PRN

## 2023-03-27 MED ORDER — AMISULPRIDE (ANTIEMETIC) 5 MG/2ML IV SOLN: 10.0000 mg | Freq: Once | INTRAVENOUS | Status: DC | PRN

## 2023-03-27 MED ORDER — ONDANSETRON HCL 4 MG/2ML IJ SOLN
INTRAMUSCULAR | Status: AC
  Filled 2023-03-27: qty 4

## 2023-03-27 MED ORDER — SODIUM CHLORIDE 0.9 % IR SOLN
Status: DC | PRN
  Administered 2023-03-27: 1000 mL

## 2023-03-27 MED ORDER — LIDOCAINE 2% (20 MG/ML) 5 ML SYRINGE
INTRAMUSCULAR | Status: DC | PRN
  Administered 2023-03-27: 100 mg via INTRAVENOUS

## 2023-03-27 MED ORDER — ROCURONIUM BROMIDE 10 MG/ML (PF) SYRINGE
PREFILLED_SYRINGE | INTRAVENOUS | Status: DC | PRN
  Administered 2023-03-27: 60 mg via INTRAVENOUS

## 2023-03-27 MED ORDER — LIDOCAINE 2% (20 MG/ML) 5 ML SYRINGE
INTRAMUSCULAR | Status: AC
  Filled 2023-03-27: qty 10

## 2023-03-27 MED ORDER — FENTANYL CITRATE (PF) 250 MCG/5ML IJ SOLN
INTRAMUSCULAR | Status: AC
  Filled 2023-03-27: qty 5

## 2023-03-27 MED ORDER — PROPOFOL 1000 MG/100ML IV EMUL
INTRAVENOUS | Status: AC
  Filled 2023-03-27: qty 100

## 2023-03-27 MED ORDER — FENTANYL CITRATE (PF) 250 MCG/5ML IJ SOLN
INTRAMUSCULAR | Status: DC | PRN
  Administered 2023-03-27: 100 ug via INTRAVENOUS

## 2023-03-27 MED ORDER — ACETAMINOPHEN 500 MG PO TABS
1000.0000 mg | ORAL_TABLET | ORAL | Status: AC
  Administered 2023-03-27: 1000 mg via ORAL
  Filled 2023-03-27: qty 2

## 2023-03-27 MED ORDER — 0.9 % SODIUM CHLORIDE (POUR BTL) OPTIME
TOPICAL | Status: DC | PRN
  Administered 2023-03-27: 1000 mL

## 2023-03-27 MED ORDER — BUPIVACAINE HCL (PF) 0.25 % IJ SOLN
INTRAMUSCULAR | Status: DC | PRN
  Administered 2023-03-27: 18 mL

## 2023-03-27 MED ORDER — DEXAMETHASONE SODIUM PHOSPHATE 10 MG/ML IJ SOLN
INTRAMUSCULAR | Status: DC | PRN
  Administered 2023-03-27: 10 mg via INTRAVENOUS

## 2023-03-27 MED ORDER — PROPOFOL 10 MG/ML IV BOLUS
INTRAVENOUS | Status: AC
  Filled 2023-03-27: qty 20

## 2023-03-27 MED ORDER — LACTATED RINGERS IV SOLN: INTRAVENOUS | Status: DC

## 2023-03-27 MED ORDER — POVIDONE-IODINE 10 % EX SOLN
CUTANEOUS | Status: DC | PRN
  Administered 2023-03-27: 1 via TOPICAL

## 2023-03-27 MED ORDER — CELECOXIB 200 MG PO CAPS
200.0000 mg | ORAL_CAPSULE | ORAL | Status: AC
  Administered 2023-03-27: 200 mg via ORAL
  Filled 2023-03-27: qty 1

## 2023-03-27 MED ORDER — ROCURONIUM BROMIDE 10 MG/ML (PF) SYRINGE
PREFILLED_SYRINGE | INTRAVENOUS | Status: AC
  Filled 2023-03-27: qty 10

## 2023-03-27 MED ORDER — DEXAMETHASONE SODIUM PHOSPHATE 10 MG/ML IJ SOLN
INTRAMUSCULAR | Status: AC
  Filled 2023-03-27: qty 2

## 2023-03-27 MED ORDER — PROPOFOL 10 MG/ML IV BOLUS
INTRAVENOUS | Status: DC | PRN
  Administered 2023-03-27: 200 mg via INTRAVENOUS

## 2023-03-27 MED ORDER — SUGAMMADEX SODIUM 200 MG/2ML IV SOLN
INTRAVENOUS | Status: DC | PRN
  Administered 2023-03-27: 200 mg via INTRAVENOUS

## 2023-03-27 MED ORDER — PHENYLEPHRINE 80 MCG/ML (10ML) SYRINGE FOR IV PUSH (FOR BLOOD PRESSURE SUPPORT)
PREFILLED_SYRINGE | INTRAVENOUS | Status: AC
  Filled 2023-03-27: qty 10

## 2023-03-27 MED ORDER — CEFAZOLIN SODIUM-DEXTROSE 2-4 GM/100ML-% IV SOLN
2.0000 g | INTRAVENOUS | Status: AC
  Administered 2023-03-27: 2 g via INTRAVENOUS
  Filled 2023-03-27: qty 100

## 2023-03-27 SURGICAL SUPPLY — 46 items
ADH SKN CLS APL DERMABOND .7 (GAUZE/BANDAGES/DRESSINGS) ×1
APL PRP STRL LF DISP 70% ISPRP (MISCELLANEOUS) ×1
APPLIER CLIP 5 13 M/L LIGAMAX5 (MISCELLANEOUS) ×1
APR CLP MED LRG 5 ANG JAW (MISCELLANEOUS) ×1
BAG COUNTER SPONGE SURGICOUNT (BAG) ×1 IMPLANT
BAG SPEC RTRVL 10 TROC 200 (ENDOMECHANICALS)
BAG SPNG CNTER NS LX DISP (BAG) ×1
BLADE CLIPPER SURG (BLADE) IMPLANT
CANISTER SUCT 3000ML PPV (MISCELLANEOUS) ×1 IMPLANT
CHLORAPREP W/TINT 26 (MISCELLANEOUS) ×1 IMPLANT
CLIP APPLIE 5 13 M/L LIGAMAX5 (MISCELLANEOUS) ×1 IMPLANT
COVER SURGICAL LIGHT HANDLE (MISCELLANEOUS) ×1 IMPLANT
DERMABOND ADVANCED .7 DNX12 (GAUZE/BANDAGES/DRESSINGS) ×1 IMPLANT
ELECT REM PT RETURN 9FT ADLT (ELECTROSURGICAL) ×1
ELECTRODE REM PT RTRN 9FT ADLT (ELECTROSURGICAL) ×1 IMPLANT
GLOVE BIOGEL PI IND STRL 6 (GLOVE) ×1 IMPLANT
GLOVE BIOGEL PI MICRO STRL 5.5 (GLOVE) ×1 IMPLANT
GOWN STRL REUS W/ TWL LRG LVL3 (GOWN DISPOSABLE) ×3 IMPLANT
GOWN STRL REUS W/TWL LRG LVL3 (GOWN DISPOSABLE) ×3
IRRIG SUCT STRYKERFLOW 2 WTIP (MISCELLANEOUS) ×1
IRRIGATION SUCT STRKRFLW 2 WTP (MISCELLANEOUS) ×1 IMPLANT
KIT BASIN OR (CUSTOM PROCEDURE TRAY) ×1 IMPLANT
KIT TURNOVER KIT B (KITS) ×1 IMPLANT
L-HOOK LAP DISP 36CM (ELECTROSURGICAL) ×1
LHOOK LAP DISP 36CM (ELECTROSURGICAL) ×1 IMPLANT
NDL INSUFFLATION 14GA 120MM (NEEDLE) IMPLANT
NEEDLE INSUFFLATION 14GA 120MM (NEEDLE) IMPLANT
NS IRRIG 1000ML POUR BTL (IV SOLUTION) ×1 IMPLANT
PAD ARMBOARD 7.5X6 YLW CONV (MISCELLANEOUS) ×1 IMPLANT
PENCIL BUTTON HOLSTER BLD 10FT (ELECTRODE) ×1 IMPLANT
POUCH RETRIEVAL ECOSAC 10 (ENDOMECHANICALS) IMPLANT
SCISSORS LAP 5X35 DISP (ENDOMECHANICALS) ×1 IMPLANT
SET TUBE SMOKE EVAC HIGH FLOW (TUBING) ×1 IMPLANT
SLEEVE Z-THREAD 5X100MM (TROCAR) ×2 IMPLANT
SUT MNCRL AB 4-0 PS2 18 (SUTURE) ×1 IMPLANT
SUT VICRYL 0 UR6 27IN ABS (SUTURE) IMPLANT
SYS BAG RETRIEVAL 10MM (BASKET) ×1
SYSTEM BAG RETRIEVAL 10MM (BASKET) IMPLANT
TOWEL GREEN STERILE (TOWEL DISPOSABLE) ×1 IMPLANT
TOWEL GREEN STERILE FF (TOWEL DISPOSABLE) ×1 IMPLANT
TRAY LAPAROSCOPIC MC (CUSTOM PROCEDURE TRAY) ×1 IMPLANT
TROCAR BALLN 12MMX100 BLUNT (TROCAR) ×1 IMPLANT
TROCAR Z THREAD OPTICAL 12X100 (TROCAR) IMPLANT
TROCAR Z-THREAD OPTICAL 5X100M (TROCAR) ×1 IMPLANT
WARMER LAPAROSCOPE (MISCELLANEOUS) ×1 IMPLANT
WATER STERILE IRR 1000ML POUR (IV SOLUTION) ×1 IMPLANT

## 2023-03-27 NOTE — Anesthesia Postprocedure Evaluation (Signed)
Anesthesia Post Note  Patient: DAMIYON COGAR  Procedure(s) Performed: LAPAROSCOPIC CHOLECYSTECTOMY (Abdomen)     Patient location during evaluation: PACU Anesthesia Type: General Level of consciousness: awake Pain management: pain level controlled Vital Signs Assessment: post-procedure vital signs reviewed and stable Respiratory status: spontaneous breathing, nonlabored ventilation and respiratory function stable Cardiovascular status: blood pressure returned to baseline and stable Postop Assessment: no apparent nausea or vomiting Anesthetic complications: no  No notable events documented.  Last Vitals:  Vitals:   03/27/23 1045 03/27/23 1100  BP: (!) 141/81 136/85  Pulse: (!) 51 (!) 52  Resp: 15 15  Temp:  (!) 36.1 C  SpO2: 97% 99%    Last Pain:  Vitals:   03/27/23 1100  TempSrc:   PainSc: 0-No pain                 Linton Rump

## 2023-03-27 NOTE — Anesthesia Procedure Notes (Signed)
Procedure Name: Intubation Date/Time: 03/27/2023 9:30 AM  Performed by: Garfield Cornea, CRNAPre-anesthesia Checklist: Patient identified, Emergency Drugs available, Suction available and Patient being monitored Patient Re-evaluated:Patient Re-evaluated prior to induction Oxygen Delivery Method: Circle System Utilized Preoxygenation: Pre-oxygenation with 100% oxygen Induction Type: IV induction Ventilation: Two handed mask ventilation required Laryngoscope Size: Mac and 4 Grade View: Grade I Tube type: Oral Tube size: 7.5 mm Number of attempts: 1 Airway Equipment and Method: Stylet Placement Confirmation: ETT inserted through vocal cords under direct vision, positive ETCO2 and breath sounds checked- equal and bilateral Secured at: 22 cm Tube secured with: Tape Dental Injury: Teeth and Oropharynx as per pre-operative assessment  Comments: Two handed mask r/t beard.

## 2023-03-27 NOTE — Discharge Instructions (Addendum)
[facility] [place] [facility] DISCHARGE INSTRUCTIONS  Activity No heavy lifting greater than 15 pounds for 4 weeks after surgery. Ok to shower in 24 hours, but do not bathe or submerge incisions underwater. Do not drive while taking narcotic pain medication.  Wound Care Your incisions are covered with skin glue called Dermabond. This will peel off on its own over time. You may shower and allow warm soapy water to run over your incisions. Gently pat dry. Do not submerge your incision underwater. Monitor your incision for any new redness, tenderness, or drainage.  When to Call us: Fever greater than 100.5 New redness, drainage, or swelling at incision site Severe pain, nausea, or vomiting Jaundice (yellowing of the whites of the eyes or skin)  Follow-up You have an appointment scheduled with Dr. Freida Busman on June 11 at 9:30am. This will be at the [facility] Surgery office at [address], [address], [place], [place]. Please arrive at least 15 minutes prior to your scheduled appointment time.  For questions or concerns, please call the office at [phone].
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
## 2023-03-27 NOTE — Transfer of Care (Signed)
Immediate Anesthesia Transfer of Care Note  Patient: Joe Williams  Procedure(s) Performed: LAPAROSCOPIC CHOLECYSTECTOMY (Abdomen)  Patient Location: PACU  Anesthesia Type:General  Level of Consciousness: awake, alert , and oriented  Airway & Oxygen Therapy: Patient Spontanous Breathing and Patient connected to face mask oxygen  Post-op Assessment: Report given to RN and Post -op Vital signs reviewed and stable  Post vital signs: Reviewed and stable  Last Vitals:  Vitals Value Taken Time  BP    Temp    Pulse 57 03/27/23 1028  Resp    SpO2 100 % 03/27/23 1028  Vitals shown include unvalidated device data.  Last Pain:  Vitals:   03/27/23 0723  TempSrc:   PainSc: 0-No pain      Patients Stated Pain Goal: 0 (03/27/23 0723)  Complications: No notable events documented.

## 2023-03-27 NOTE — Op Note (Addendum)
Date: 03/27/23  Patient: Joe Williams MRN: [id number]  Preoperative Diagnosis: Symptomatic cholelithiasis Postoperative Diagnosis: Same  Procedure: Laparoscopic cholecystectomy  Surgeon: Sophronia Simas, MD Assistant: Jeronimo Greaves, RNFA  EBL: Minimal  Anesthesia: General endotracheal  Specimens: Gallbladder  Indications: Mr. [name] is a 67 yo male who has had several severe episodes of epigastric abdominal pain. He also had transiently elevated LFTs during one of these episodes and was admitted to the hospital. MRCP showed no choledocholithiasis and his LFTs normalized. He was referred for cholecystectomy, and after a discussion of the risks and benefits, agreed to proceed with cholecystectomy.  Findings: Cholelithiasis without evidence of acute cholecystitis.  Procedure details: Informed consent was obtained in the preoperative area prior to the procedure. The patient was brought to the operating room and placed on the table in the supine position. General anesthesia was induced and appropriate lines and drains were placed for intraoperative monitoring. Perioperative antibiotics were administered per SCIP guidelines. The abdomen was prepped and draped in the usual sterile fashion. A pre-procedure timeout was taken verifying patient identity, surgical site and procedure to be performed.  A small supraumbilical skin incision was made, the subcutaneous tissue was divided with cautery, and the umbilical stalk was grasped and elevated. The fascia was incised and the peritoneal cavity was directly visualized. A 12mm Hassan trocar was placed and the abdomen was insufflated. The peritoneal cavity was inspected with no evidence of visceral or vascular injury. Three 5mm ports were placed in the right subcostal margin, all under direct visualization. The fundus of the gallbladder was grasped and retracted cephalad. There were minimal omental adhesions to the gallbladder, which were taken down with  cautery. The infundibulum was retracted laterally and the peritoneum overlying the gallbladder was incised with cautery. The cystic triangle was dissected out using cautery and blunt dissection, and the critical view of safety was obtained. The cystic duct and cystic artery were clipped and ligated, leaving three clips behind on the cystic duct stump. The gallbladder was taken off the liver using cautery, and the specimen was placed in an endocatch bag. The surgical site was irrigated with saline until the effluent was clear. Hemostasis was achieved in the gallbladder fossa using cautery. The cystic duct and artery stumps were visually inspected and there was no evidence of bile leak or bleeding. The ports were removed under direct visualization and the abdomen was desufflated. The specimen was extracted via the umbilical port site, which required extension of the fascial incision due to the presence of numerous stones within the gallbladder. The umbilical port site fascia was closed with 0 vicryl suture. The skin at all port sites was closed with 4-0 monocryl subcuticular suture. Dermabond was applied.  The patient tolerated the procedure well with no apparent complications. All counts were correct x2 at the end of the procedure. The patient was extubated and taken to PACU in stable condition.  Sophronia Simas, MD 03/27/23 11:47 AM

## 2023-03-28 ENCOUNTER — Encounter (HOSPITAL_COMMUNITY): Payer: Self-pay | Admitting: Surgery

## 2023-03-28 LAB — SURGICAL PATHOLOGY

## 2023-04-03 ENCOUNTER — Other Ambulatory Visit: Payer: Self-pay | Admitting: Internal Medicine

## 2023-05-27 ENCOUNTER — Other Ambulatory Visit: Payer: Self-pay | Admitting: Internal Medicine

## 2023-06-03 ENCOUNTER — Other Ambulatory Visit: Payer: Self-pay | Admitting: Cardiology

## 2023-07-22 ENCOUNTER — Other Ambulatory Visit: Payer: Self-pay | Admitting: Internal Medicine

## 2023-09-16 ENCOUNTER — Other Ambulatory Visit: Payer: Self-pay | Admitting: Internal Medicine

## 2023-09-17 ENCOUNTER — Other Ambulatory Visit: Payer: Self-pay | Admitting: Internal Medicine

## 2023-09-19 ENCOUNTER — Telehealth: Payer: Self-pay | Admitting: Internal Medicine

## 2023-09-19 NOTE — Telephone Encounter (Signed)
Patient's next OV is 01/01/2024.   Prescription Request  09/19/2023  LOV: 12/28/2022  What is the name of the medication or equipment? meloxicam (MOBIC) 15 MG tablet  omeprazole (PRILOSEC) 40 MG capsule   Have you contacted your pharmacy to request a refill? Yes   Which pharmacy would you like this sent to?  [id number] [place], [place] - [address] [place] [address] Phone: [phone] Fax: [phone]    Patient notified that their request is being sent to the clinical staff for review and that they should receive a response within 2 business days.   Please advise at Mobile [phone] (mobile)

## 2023-09-20 MED ORDER — MELOXICAM 15 MG PO TABS: 15.0000 mg | ORAL_TABLET | Freq: Every day | ORAL | 1 refills | Status: DC

## 2023-09-20 NOTE — Telephone Encounter (Signed)
Okay.  Thanks.

## 2023-09-26 ENCOUNTER — Other Ambulatory Visit: Payer: Self-pay

## 2023-09-26 MED ORDER — OMEPRAZOLE 40 MG PO CPDR: 40.0000 mg | DELAYED_RELEASE_CAPSULE | Freq: Every day | ORAL | 2 refills | Status: DC

## 2023-09-26 NOTE — Telephone Encounter (Signed)
Requested rx was sent into pts pharmacy.

## 2023-09-26 NOTE — Telephone Encounter (Signed)
Patient called and said the pharmacy received his meloxicam, but they did not receive omeprazole (PRILOSEC) 40 MG capsule. He would like to know if that can be sent in as well. Best callback is [phone].

## 2023-11-01 ENCOUNTER — Ambulatory Visit: Payer: Medicare Other | Admitting: Internal Medicine

## 2023-11-01 VITALS — BP 118/80 | HR 95 | Temp 98.6°F | Ht 72.0 in | Wt 252.0 lb

## 2023-11-01 DIAGNOSIS — R197 Diarrhea, unspecified: Secondary | ICD-10-CM

## 2023-11-01 DIAGNOSIS — E785 Hyperlipidemia, unspecified: Secondary | ICD-10-CM

## 2023-11-01 MED ORDER — METRONIDAZOLE 500 MG PO TABS: 500.0000 mg | ORAL_TABLET | Freq: Three times a day (TID) | ORAL | 0 refills | Status: DC

## 2023-11-01 MED ORDER — COLESEVELAM HCL 625 MG PO TABS: ORAL_TABLET | ORAL | 3 refills | Status: DC

## 2023-11-01 NOTE — Assessment & Plan Note (Addendum)
We started [employer]
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
## 2023-11-08 DIAGNOSIS — R197 Diarrhea, unspecified: Secondary | ICD-10-CM | POA: Insufficient documentation

## 2023-11-08 NOTE — Assessment & Plan Note (Signed)
New- postcholecystectomy aggravated by traveler's diarrhea Prescribed Flagyl.  Prescribed WelChol

## 2023-11-16 ENCOUNTER — Other Ambulatory Visit: Payer: Self-pay | Admitting: Internal Medicine

## 2023-12-19 DIAGNOSIS — M1712 Unilateral primary osteoarthritis, left knee: Secondary | ICD-10-CM | POA: Diagnosis not present

## 2023-12-25 ENCOUNTER — Telehealth: Payer: Self-pay | Admitting: Internal Medicine

## 2023-12-25 DIAGNOSIS — Z0000 Encounter for general adult medical examination without abnormal findings: Secondary | ICD-10-CM

## 2023-12-25 DIAGNOSIS — R739 Hyperglycemia, unspecified: Secondary | ICD-10-CM

## 2023-12-25 DIAGNOSIS — I2583 Coronary atherosclerosis due to lipid rich plaque: Secondary | ICD-10-CM

## 2023-12-25 DIAGNOSIS — R7989 Other specified abnormal findings of blood chemistry: Secondary | ICD-10-CM

## 2023-12-25 NOTE — Telephone Encounter (Signed)
 Noted.  Labs are ordered.  Thank you

## 2023-12-25 NOTE — Telephone Encounter (Signed)
Copied from CRM [id number]. Topic: Clinical - Request for Lab/Test Order >> Dec 25, 2023  9:11 AM Almira Coaster wrote: Reason for CRM: Patient is scheduled for his physical on 01/03/2024, he is planning to have his labs done this Thursday 12/28/2023 he would like for orders to be placed. Please call patient to confirm once labs are placed.

## 2023-12-28 ENCOUNTER — Other Ambulatory Visit: Payer: Medicare Other

## 2023-12-28 DIAGNOSIS — I2583 Coronary atherosclerosis due to lipid rich plaque: Secondary | ICD-10-CM | POA: Diagnosis not present

## 2023-12-28 DIAGNOSIS — Z0000 Encounter for general adult medical examination without abnormal findings: Secondary | ICD-10-CM | POA: Diagnosis not present

## 2023-12-28 DIAGNOSIS — R739 Hyperglycemia, unspecified: Secondary | ICD-10-CM | POA: Diagnosis not present

## 2023-12-28 DIAGNOSIS — Z125 Encounter for screening for malignant neoplasm of prostate: Secondary | ICD-10-CM | POA: Diagnosis not present

## 2023-12-28 DIAGNOSIS — E291 Testicular hypofunction: Secondary | ICD-10-CM | POA: Diagnosis not present

## 2023-12-28 DIAGNOSIS — I2510 Atherosclerotic heart disease of native coronary artery without angina pectoris: Secondary | ICD-10-CM

## 2023-12-28 DIAGNOSIS — R7989 Other specified abnormal findings of blood chemistry: Secondary | ICD-10-CM

## 2023-12-28 LAB — URINALYSIS
Bilirubin Urine: NEGATIVE
Hgb urine dipstick: NEGATIVE
Ketones, ur: NEGATIVE
Leukocytes,Ua: NEGATIVE
Nitrite: NEGATIVE
Specific Gravity, Urine: 1.02 (ref 1.000–1.030)
Total Protein, Urine: NEGATIVE
Urine Glucose: NEGATIVE
Urobilinogen, UA: 0.2 (ref 0.0–1.0)
pH: 6 (ref 5.0–8.0)

## 2023-12-28 LAB — COMPREHENSIVE METABOLIC PANEL
ALT: 19 U/L (ref 0–53)
AST: 19 U/L (ref 0–37)
Albumin: 4.5 g/dL (ref 3.5–5.2)
Alkaline Phosphatase: 36 U/L — ABNORMAL LOW (ref 39–117)
BUN: 18 mg/dL (ref 6–23)
CO2: 26 meq/L (ref 19–32)
Calcium: 9 mg/dL (ref 8.4–10.5)
Chloride: 104 meq/L (ref 96–112)
Creatinine, Ser: 0.88 mg/dL (ref 0.40–1.50)
GFR: 88.4 mL/min (ref >60.00)
Glucose, Bld: 110 mg/dL — ABNORMAL HIGH (ref 70–99)
Potassium: 4.4 meq/L (ref 3.5–5.1)
Sodium: 140 meq/L (ref 135–145)
Total Bilirubin: 0.9 mg/dL (ref 0.2–1.2)
Total Protein: 6.6 g/dL (ref 6.0–8.3)

## 2023-12-28 LAB — CBC WITH DIFFERENTIAL/PLATELET
Basophils Absolute: 0.1 10*3/uL (ref 0.0–0.1)
Basophils Relative: 0.9 % (ref 0.0–3.0)
Eosinophils Absolute: 0.2 10*3/uL (ref 0.0–0.7)
Eosinophils Relative: 3 % (ref 0.0–5.0)
HCT: 41.5 % (ref 39.0–52.0)
Hemoglobin: 14.1 g/dL (ref 13.0–17.0)
Lymphocytes Relative: 21.7 % (ref 12.0–46.0)
Lymphs Abs: 1.3 10*3/uL (ref 0.7–4.0)
MCHC: 34.1 g/dL (ref 30.0–36.0)
MCV: 87.2 fL (ref 78.0–100.0)
Monocytes Absolute: 0.5 10*3/uL (ref 0.1–1.0)
Monocytes Relative: 7.4 % (ref 3.0–12.0)
Neutro Abs: 4 10*3/uL (ref 1.4–7.7)
Neutrophils Relative %: 67 % (ref 43.0–77.0)
Platelets: 201 10*3/uL (ref 150.0–400.0)
RBC: 4.76 Mil/uL (ref 4.22–5.81)
RDW: 13.7 % (ref 11.5–15.5)
WBC: 6 10*3/uL (ref 4.0–10.5)

## 2023-12-28 LAB — TESTOSTERONE: Testosterone: 291.12 ng/dL — ABNORMAL LOW (ref 300.00–890.00)

## 2023-12-28 LAB — LIPID PANEL
Cholesterol: 105 mg/dL (ref 0–200)
HDL: 43.5 mg/dL (ref >39.00)
LDL Cholesterol: 42 mg/dL (ref 0–99)
NonHDL: 61.62
Total CHOL/HDL Ratio: 2
Triglycerides: 98 mg/dL (ref 0.0–149.0)
VLDL: 19.6 mg/dL (ref 0.0–40.0)

## 2023-12-28 LAB — PSA: PSA: 1.39 ng/mL (ref 0.10–4.00)

## 2023-12-28 LAB — TSH: TSH: 1.39 u[IU]/mL (ref 0.35–5.50)

## 2023-12-28 LAB — HEMOGLOBIN A1C: Hgb A1c MFr Bld: 6.1 % (ref 4.6–6.5)

## 2024-01-01 ENCOUNTER — Encounter: Payer: Medicare Other | Admitting: Internal Medicine

## 2024-01-03 ENCOUNTER — Encounter: Payer: Medicare Other | Admitting: Internal Medicine

## 2024-01-04 ENCOUNTER — Telehealth: Payer: Self-pay | Admitting: Cardiology

## 2024-01-04 ENCOUNTER — Telehealth: Payer: Self-pay

## 2024-01-04 NOTE — Telephone Encounter (Signed)
Spoke with pt regarding preop clearance. Pt is scheduled for a televisit on 01/05/24.    Patient Consent for Virtual Visit        Joe Williams has provided verbal consent on 01/04/2024 for a virtual visit (video or telephone).   CONSENT FOR VIRTUAL VISIT FOR:  Joe Williams  By participating in this virtual visit I agree to the following:  I hereby voluntarily request, consent and authorize [facility] and its employed or contracted physicians, physician assistants, nurse practitioners or other licensed health care professionals (the Practitioner), to provide me with telemedicine health care services (the "Services") as deemed necessary by the treating Practitioner. I acknowledge and consent to receive the Services by the Practitioner via telemedicine. I understand that the telemedicine visit will involve communicating with the Practitioner through live audiovisual communication technology and the disclosure of certain medical information by electronic transmission. I acknowledge that I have been given the opportunity to request an in-person assessment or other available alternative prior to the telemedicine visit and am voluntarily participating in the telemedicine visit.  I understand that I have the right to withhold or withdraw my consent to the use of telemedicine in the course of my care at any time, without affecting my right to future care or treatment, and that the Practitioner or I may terminate the telemedicine visit at any time. I understand that I have the right to inspect all information obtained and/or recorded in the course of the telemedicine visit and may receive copies of available information for a reasonable fee.  I understand that some of the potential risks of receiving the Services via telemedicine include:  Delay or interruption in medical evaluation due to technological equipment failure or disruption; Information transmitted may not be sufficient (e.g. poor  resolution of images) to allow for appropriate medical decision making by the Practitioner; and/or  In rare instances, security protocols could fail, causing a breach of personal health information.  Furthermore, I acknowledge that it is my responsibility to provide information about my medical history, conditions and care that is complete and accurate to the best of my ability. I acknowledge that Practitioner's advice, recommendations, and/or decision may be based on factors not within their control, such as incomplete or inaccurate data provided by me or distortions of diagnostic images or specimens that may result from electronic transmissions. I understand that the practice of medicine is not an exact science and that Practitioner makes no warranties or guarantees regarding treatment outcomes. I acknowledge that a copy of this consent can be made available to me via my patient portal [facility] MyChart), or I can request a printed copy by calling the office of [facility].    I understand that my insurance will be billed for this visit.   I have read or had this consent read to me. I understand the contents of this consent, which adequately explains the benefits and risks of the Services being provided via telemedicine.  I have been provided ample opportunity to ask questions regarding this consent and the Services and have had my questions answered to my satisfaction. I give my informed consent for the services to be provided through the use of telemedicine in my medical care

## 2024-01-04 NOTE — Telephone Encounter (Signed)
CORRECTION FOR PROCEDURE:    LEFT KNEE REPLACEMENT  PT WILL NEED TO HOLD ASA

## 2024-01-04 NOTE — Telephone Encounter (Signed)
   Name: Joe Williams  DOB: 23-Jan-1956  MRN: [id number]  Primary Cardiologist: Armanda Magic, MD   Preoperative team, please contact this patient and set up a phone call appointment for further preoperative risk assessment. Please obtain consent and complete medication review. Thank you for your help.Last seen by Dr. Mayford Knife on 02/16/2023, and does not have appointment until 02/15/2024 after surgery scheduled for 02/02/2024.    I confirm that guidance regarding antiplatelet and oral anticoagulation therapy has been completed and, if necessary, noted below.  Per office protocol, if patient is without any new symptoms or concerns at the time of their virtual visit, he/she may hold ASA for 7 days prior to procedure. Please resume ASA as soon as possible postprocedure, at the discretion of the surgeon.    I also confirmed the patient resides in the state of [place]. As per [facility] Medical Board telemedicine laws, the patient must reside in the state in which the provider is licensed.   Joni Reining, NP 01/04/2024, 3:57 PM [facility]

## 2024-01-04 NOTE — Telephone Encounter (Signed)
   Pre-operative Risk Assessment    Patient Name: Joe Williams  DOB: [birth year] MRN: [id number]   Date of last office visit: 02/16/23 Date of next office visit: 02/15/24   Request for Surgical Clearance    Procedure:   left knew replacement  Date of Surgery:  Clearance 02/02/24                                Surgeon:  Dr. Gean Birchwood  Surgeon's Group or Practice Name:  [facility] Phone number:  [phone] Fax number:  [phone]   Type of Clearance Requested:   - Medical  - Pharmacy:  Hold    TBD   Type of Anesthesia:  Spinal   Additional requests/questions:      SignedFilomena Jungling   01/04/2024, 3:28 PM

## 2024-01-04 NOTE — Telephone Encounter (Signed)
Spoke with pt regarding preop clearance. Pt is scheduled for a televisit on 01/05/24.

## 2024-01-05 ENCOUNTER — Ambulatory Visit: Payer: Medicare Other | Attending: Cardiology

## 2024-01-05 DIAGNOSIS — Z01818 Encounter for other preprocedural examination: Secondary | ICD-10-CM

## 2024-01-05 DIAGNOSIS — Z01810 Encounter for preprocedural cardiovascular examination: Secondary | ICD-10-CM | POA: Diagnosis not present

## 2024-01-05 NOTE — Progress Notes (Signed)
Virtual Visit via Telephone Note   Because of Joe Williams co-morbid illnesses, he is at least at moderate risk for complications without adequate follow up.  This format is felt to be most appropriate for this patient at this time.  Due to technical limitations with video connection (technology), today's appointment will be conducted as an audio only telehealth visit, and Joe Williams verbally agreed to proceed in this manner.   All issues noted in this document were discussed and addressed.  No physical exam could be performed with this format.  Evaluation Performed:  Preoperative cardiovascular risk assessment _____________   Date:  01/05/2024   Patient ID:  Joe Williams, DOB 12/17/1955, MRN [id number] Patient Location:  Home Provider location:   Office  Primary Care Provider:  Tresa Garter, MD Primary Cardiologist:  Armanda Magic, MD  Chief Complaint / Patient Profile   68 y.o. y/o male with a h/o hx of HLD, palpitations, tobacco use, glucose intolerance and family hx of CAD underwent coronary calcium score. Coronary calcium score was 242.   He is pending left knee replacement on 02/02/2024 with Dr. Gean Williams and presents today for telephonic preoperative cardiovascular risk assessment and recommendations for holding ASA.   History of Present Illness    Joe Williams is a 68 y.o. male who presents via audio/video conferencing for a telehealth visit today.  Pt was last seen in cardiology clinic on 02/16/2023  by Dr.Traci Turner.  At that time Joe Williams was doing well .  The patient is now pending procedure as outlined above. Since his last visit, he has done well. Is without cardiac complaint. He is planning on chopping wood and carrying it this weekend. His energy level is very good.   Past Medical History    Past Medical History:  Diagnosis Date   Agatston coronary artery calcium score between 200 and 399    score 272 on CT   COLONIC POLYPS, HX OF     rectal-hyperplastic only 2007   DYSFUNCTION, SINOATRIAL NODE    chronic sinus brady   GERD    GLUCOSE INTOLERANCE    History of shingles    HYPERLIPIDEMIA    Morbid obesity (HCC)    PLANTAR FASCIITIS    TOBACCO USE, QUIT    Past Surgical History:  Procedure Laterality Date   CHOLECYSTECTOMY N/A 03/27/2023   Procedure: LAPAROSCOPIC CHOLECYSTECTOMY;  Surgeon: Fritzi Mandes, MD;  Location: [facility] OR;  Service: General;  Laterality: N/A;   COLONOSCOPY     KNEE ARTHROSCOPY Bilateral 2019   ROTATOR CUFF REPAIR  1995 (approx)   right -Dr. Turner Daniels    Allergies  Allergies  Allergen Reactions   Lovastatin     achy   Pravastatin     ??hepatitis   Tetracycline     Home Medications    Prior to Admission medications   Medication Sig Start Date End Date Taking? Authorizing Provider  aspirin EC 81 MG tablet Take 81 mg by mouth daily.    [provider]  cholecalciferol (VITAMIN D) 1000 UNITS tablet Take 1,000 Units by mouth daily.    [provider]  colesevelam (WELCHOL) 625 MG tablet Take 1-3 tablets once or twice a day to prevent diarrhea 11/01/23   Plotnikov, Georgina Quint, MD  meloxicam (MOBIC) 15 MG tablet TAKE 1 TABLET (15 MG TOTAL) BY MOUTH DAILY. 11/16/23   Plotnikov, Georgina Quint, MD  Menthol, Topical Analgesic, 4 % GEL Apply 1 application  topically  as needed (pain). To knees    [provider]  metroNIDAZOLE (FLAGYL) 500 MG tablet Take 1 tablet (500 mg total) by mouth 3 (three) times daily. Patient not taking: Reported on 01/04/2024 11/01/23   Plotnikov, Georgina Quint, MD  omeprazole (PRILOSEC) 40 MG capsule Take 1 capsule (40 mg total) by mouth daily. 09/26/23   Plotnikov, Georgina Quint, MD  ondansetron (ZOFRAN) 4 MG tablet TAKE 1 TABLET BY MOUTH EVERY 8 HOURS AS NEEDED FOR NAUSEA AND VOMITING Patient taking differently: Take 4 mg by mouth every 8 (eight) hours as needed for nausea or vomiting. 01/30/23   Plotnikov, Georgina Quint, MD  potassium gluconate 595 (99 K) MG TABS  tablet Take 1 tablet by mouth daily.    [provider]  rosuvastatin (CRESTOR) 10 MG tablet TAKE 1 TABLET BY MOUTH EVERY DAY 03/03/23   Quintella Reichert, MD  tamsulosin (FLOMAX) 0.4 MG CAPS capsule TAKE 1 CAPSULE BY MOUTH EVERY DAY Patient taking differently: Take 0.4 mg by mouth daily. 01/30/23   Plotnikov, Georgina Quint, MD  vitamin C (ASCORBIC ACID) 500 MG tablet Take 500 mg by mouth daily.    [provider]    Physical Exam    Vital Signs:  Joe Williams does not have vital signs available for review today.  Given telephonic nature of communication, physical exam is limited. AAOx3. NAD. Normal affect.  Speech and respirations are unlabored.  Accessory Clinical Findings    None  Assessment & Plan    1.  Preoperative Cardiovascular Risk Assessment:  According to the Revised Cardiac Risk Index (RCRI), his Perioperative Risk of Major Cardiac Event is (%): 0.9  His Functional Capacity in METs is: 9.89 according to the Duke Activity Status Index (DASI).   The patient was advised that if he develops new symptoms prior to surgery to contact our office to arrange for a follow-up visit, and he verbalized understanding.  Per office protocol, if patient is without any new symptoms or concerns at the time of their virtual visit, he/she may hold ASA for 7 days prior to procedure. Please resume ASA as soon as possible postprocedure, at the discretion of the surgeon.    A copy of this note will be routed to requesting surgeon.   Time:   Today, I have spent 10 minutes with the patient with telehealth technology discussing medical history, symptoms, and management plan.     Joni Reining, NP  01/05/2024, 2:21 PM

## 2024-01-16 ENCOUNTER — Ambulatory Visit: Payer: Medicare Other | Admitting: Internal Medicine

## 2024-01-16 ENCOUNTER — Encounter: Payer: Self-pay | Admitting: Internal Medicine

## 2024-01-16 ENCOUNTER — Telehealth: Payer: Self-pay | Admitting: Internal Medicine

## 2024-01-16 VITALS — BP 114/76 | HR 66 | Temp 98.0°F | Ht 72.0 in | Wt 245.0 lb

## 2024-01-16 DIAGNOSIS — I2583 Coronary atherosclerosis due to lipid rich plaque: Secondary | ICD-10-CM

## 2024-01-16 DIAGNOSIS — Z0000 Encounter for general adult medical examination without abnormal findings: Secondary | ICD-10-CM

## 2024-01-16 DIAGNOSIS — R739 Hyperglycemia, unspecified: Secondary | ICD-10-CM

## 2024-01-16 MED ORDER — TAMSULOSIN HCL 0.4 MG PO CAPS: 0.4000 mg | ORAL_CAPSULE | Freq: Every day | ORAL | 3 refills | Status: DC

## 2024-01-16 NOTE — Assessment & Plan Note (Signed)
 Coronary calcium score of 242. This was 75th percentile for age and sex matched control.  Lovastatin intolerant Dr Mayford Knife Statin intolerant Take fish oil for now F/u w/dr Mayford Knife

## 2024-01-16 NOTE — Assessment & Plan Note (Signed)
  We discussed age appropriate health related issues, including available/recomended screening tests and vaccinations. Labs were ordered to be later reviewed . All questions were answered. We discussed one or more of the following - seat belt use, use of sunscreen/sun exposure exercise, safe sex, fall risk reduction, second hand smoke exposure, firearm use and storage, seat belt use, a need for adhering to healthy diet and exercise. Labs were ordered.  All questions were answered. Colon 1/18, due in 1/28 Dr Leone Payor 2020 A cardiac CT scan for calcium scoring IMPRESSION: Coronary calcium score of 242. This was 75th percentile for age and sex matched control. L TKR in 2 wks is planned

## 2024-01-16 NOTE — Telephone Encounter (Signed)
 Copied from CRM [id number]. Topic: General - Other >> Jan 16, 2024 12:41 PM Rodman Pickle T wrote: Reason for CRM: rebecca from [facility] needs the form for patient Joe Williams completed she needs any lab results and office notes from his last visit before patient can have surgery fax number is [phone]       [phone]

## 2024-01-16 NOTE — Progress Notes (Signed)
 Subjective:  Patient ID: Joe Williams, male    DOB: [birth year]  Age: 68 y.o. MRN: [id number]  CC: Annual Exam   HPI Joe Williams presents for a well exam L TKR in 2 wks is planned  Outpatient Medications Prior to Visit  Medication Sig Dispense Refill   aspirin EC 81 MG tablet Take 81 mg by mouth daily.     cholecalciferol (VITAMIN D) 1000 UNITS tablet Take 1,000 Units by mouth daily.     colesevelam (WELCHOL) 625 MG tablet Take 1-3 tablets once or twice a day to prevent diarrhea 180 tablet 3   meloxicam (MOBIC) 15 MG tablet TAKE 1 TABLET (15 MG TOTAL) BY MOUTH DAILY. 30 tablet 1   Menthol, Topical Analgesic, 4 % GEL Apply 1 application  topically as needed (pain). To knees     omeprazole (PRILOSEC) 40 MG capsule Take 1 capsule (40 mg total) by mouth daily. 90 capsule 2   potassium gluconate 595 (99 K) MG TABS tablet Take 1 tablet by mouth daily.     rosuvastatin (CRESTOR) 10 MG tablet TAKE 1 TABLET BY MOUTH EVERY DAY 90 tablet 3   vitamin C (ASCORBIC ACID) 500 MG tablet Take 500 mg by mouth daily.     tamsulosin (FLOMAX) 0.4 MG CAPS capsule TAKE 1 CAPSULE BY MOUTH EVERY DAY (Patient taking differently: Take 0.4 mg by mouth daily.) 90 capsule 3   metroNIDAZOLE (FLAGYL) 500 MG tablet Take 1 tablet (500 mg total) by mouth 3 (three) times daily. (Patient not taking: Reported on 01/04/2024) 30 tablet 0   ondansetron (ZOFRAN) 4 MG tablet TAKE 1 TABLET BY MOUTH EVERY 8 HOURS AS NEEDED FOR NAUSEA AND VOMITING (Patient taking differently: Take 4 mg by mouth every 8 (eight) hours as needed for nausea or vomiting.) 20 tablet 0   No facility-administered medications prior to visit.    ROS: Review of Systems  Constitutional:  Negative for appetite change, fatigue and unexpected weight change.  HENT:  Negative for congestion, nosebleeds, sneezing, sore throat and trouble swallowing.   Eyes:  Negative for itching and visual disturbance.  Respiratory:  Negative for cough.   Cardiovascular:   Negative for chest pain, palpitations and leg swelling.  Gastrointestinal:  Negative for abdominal distention, blood in stool, diarrhea and nausea.  Genitourinary:  Negative for frequency and hematuria.  Musculoskeletal:  Negative for back pain, gait problem, joint swelling and neck pain.  Skin:  Negative for rash.  Neurological:  Negative for dizziness, tremors, speech difficulty and weakness.  Psychiatric/Behavioral:  Negative for agitation, dysphoric mood, sleep disturbance and suicidal ideas. The patient is not nervous/anxious.     Objective:  BP 114/76   Pulse 66   Temp 98 F (36.7 C) (Oral)   Ht 6' (1.829 m)   Wt 245 lb (111.1 kg)   SpO2 93%   BMI 33.23 kg/m   BP Readings from Last 3 Encounters:  01/16/24 114/76  11/01/23 118/80  03/27/23 136/85    Wt Readings from Last 3 Encounters:  01/16/24 245 lb (111.1 kg)  11/01/23 252 lb (114.3 kg)  03/27/23 249 lb (112.9 kg)    Physical Exam Constitutional:      General: He is not in acute distress.    Appearance: He is well-developed. He is obese.     Comments: NAD  Eyes:     Conjunctiva/sclera: Conjunctivae normal.     Pupils: Pupils are equal, round, and reactive to light.  Neck:     Thyroid:  No thyromegaly.     Vascular: No JVD.  Cardiovascular:     Rate and Rhythm: Normal rate and regular rhythm.     Heart sounds: Normal heart sounds. No murmur heard.    No friction rub. No gallop.  Pulmonary:     Effort: Pulmonary effort is normal. No respiratory distress.     Breath sounds: Normal breath sounds. No wheezing or rales.  Chest:     Chest wall: No tenderness.  Abdominal:     General: Bowel sounds are normal. There is no distension.     Palpations: Abdomen is soft. There is no mass.     Tenderness: There is no abdominal tenderness. There is no guarding or rebound.  Musculoskeletal:        General: No tenderness. Normal range of motion.     Cervical back: Normal range of motion.  Lymphadenopathy:      Cervical: No cervical adenopathy.  Skin:    General: Skin is warm and dry.     Findings: No rash.  Neurological:     Mental Status: He is alert and oriented to person, place, and time.     Cranial Nerves: No cranial nerve deficit.     Motor: No abnormal muscle tone.     Coordination: Coordination normal.     Gait: Gait abnormal.     Deep Tendon Reflexes: Reflexes are normal and symmetric.  Psychiatric:        Behavior: Behavior normal.        Thought Content: Thought content normal.        Judgment: Judgment normal.   Antalgic gait  Lab Results  Component Value Date   WBC 6.0 12/28/2023   HGB 14.1 12/28/2023   HCT 41.5 12/28/2023   PLT 201.0 12/28/2023   GLUCOSE 110 (H) 12/28/2023   CHOL 105 12/28/2023   TRIG 98.0 12/28/2023   HDL 43.50 12/28/2023   LDLCALC 42 12/28/2023   ALT 19 12/28/2023   AST 19 12/28/2023   NA 140 12/28/2023   K 4.4 12/28/2023   CL 104 12/28/2023   CREATININE 0.88 12/28/2023   BUN 18 12/28/2023   CO2 26 12/28/2023   TSH 1.39 12/28/2023   PSA 1.39 12/28/2023   INR 1.2 05/03/2021   HGBA1C 6.1 12/28/2023    No results found.  Assessment & Plan:   Problem List Items Addressed This Visit     Well adult exam - Primary    We discussed age appropriate health related issues, including available/recomended screening tests and vaccinations. Labs were ordered to be later reviewed . All questions were answered. We discussed one or more of the following - seat belt use, use of sunscreen/sun exposure exercise, safe sex, fall risk reduction, second hand smoke exposure, firearm use and storage, seat belt use, a need for adhering to healthy diet and exercise. Labs were ordered.  All questions were answered. Colon 1/18, due in 1/28 Dr Leone Payor 2020 A cardiac CT scan for calcium scoring IMPRESSION: Coronary calcium score of 242. This was 75th percentile for age and sex matched control. L TKR in 2 wks is planned      Hyperglycemia   Coronary atherosclerosis    Coronary calcium score of 242. This was 75th percentile for age and sex matched control.  Lovastatin intolerant Dr Mayford Knife Statin intolerant Take fish oil for now F/u w/dr Mayford Knife         Meds ordered this encounter  Medications   tamsulosin (FLOMAX) 0.4 MG CAPS capsule  Sig: Take 1 capsule (0.4 mg total) by mouth daily.    Dispense:  90 capsule    Refill:  3      Follow-up: Return in about 6 months (around 07/18/2024) for a follow-up visit.  Sonda Primes, MD

## 2024-01-17 DIAGNOSIS — M1712 Unilateral primary osteoarthritis, left knee: Secondary | ICD-10-CM | POA: Diagnosis not present

## 2024-01-18 DIAGNOSIS — M1712 Unilateral primary osteoarthritis, left knee: Secondary | ICD-10-CM | POA: Diagnosis not present

## 2024-01-21 ENCOUNTER — Encounter: Payer: Self-pay | Admitting: Internal Medicine

## 2024-01-22 DIAGNOSIS — K080 Exfoliation of teeth due to systemic causes: Secondary | ICD-10-CM | POA: Diagnosis not present

## 2024-01-23 NOTE — Telephone Encounter (Signed)
 He is clear for surgery.  I thought that I signed the form.  Thanks

## 2024-01-24 ENCOUNTER — Other Ambulatory Visit: Payer: Self-pay | Admitting: Internal Medicine

## 2024-01-24 NOTE — Telephone Encounter (Signed)
 Copied from CRM [id number]. Topic: General - Other >> Jan 24, 2024  9:18 AM Fredrich Romans wrote: Reason for CRM: rebecca from [facility] called in again stating that she needs   any lab results and office notes from his last visit before patient can have surgery.She received form signed by provider however she needs this information as well

## 2024-01-26 NOTE — Telephone Encounter (Signed)
Paperwork has been faxed as requested.

## 2024-02-02 DIAGNOSIS — M25762 Osteophyte, left knee: Secondary | ICD-10-CM | POA: Diagnosis not present

## 2024-02-02 DIAGNOSIS — Z96652 Presence of left artificial knee joint: Secondary | ICD-10-CM | POA: Diagnosis not present

## 2024-02-02 DIAGNOSIS — G8918 Other acute postprocedural pain: Secondary | ICD-10-CM | POA: Diagnosis not present

## 2024-02-02 DIAGNOSIS — M21162 Varus deformity, not elsewhere classified, left knee: Secondary | ICD-10-CM | POA: Diagnosis not present

## 2024-02-02 DIAGNOSIS — M1712 Unilateral primary osteoarthritis, left knee: Secondary | ICD-10-CM | POA: Diagnosis not present

## 2024-02-05 DIAGNOSIS — M25662 Stiffness of left knee, not elsewhere classified: Secondary | ICD-10-CM | POA: Diagnosis not present

## 2024-02-05 DIAGNOSIS — Z96652 Presence of left artificial knee joint: Secondary | ICD-10-CM | POA: Diagnosis not present

## 2024-02-05 DIAGNOSIS — R531 Weakness: Secondary | ICD-10-CM | POA: Diagnosis not present

## 2024-02-08 DIAGNOSIS — M25662 Stiffness of left knee, not elsewhere classified: Secondary | ICD-10-CM | POA: Diagnosis not present

## 2024-02-08 DIAGNOSIS — R531 Weakness: Secondary | ICD-10-CM | POA: Diagnosis not present

## 2024-02-08 DIAGNOSIS — Z96652 Presence of left artificial knee joint: Secondary | ICD-10-CM | POA: Diagnosis not present

## 2024-02-13 DIAGNOSIS — M25662 Stiffness of left knee, not elsewhere classified: Secondary | ICD-10-CM | POA: Diagnosis not present

## 2024-02-13 DIAGNOSIS — M1712 Unilateral primary osteoarthritis, left knee: Secondary | ICD-10-CM | POA: Diagnosis not present

## 2024-02-13 DIAGNOSIS — Z96652 Presence of left artificial knee joint: Secondary | ICD-10-CM | POA: Diagnosis not present

## 2024-02-13 DIAGNOSIS — R531 Weakness: Secondary | ICD-10-CM | POA: Diagnosis not present

## 2024-02-15 ENCOUNTER — Encounter: Payer: Self-pay | Admitting: Cardiology

## 2024-02-15 ENCOUNTER — Ambulatory Visit: Payer: Medicare Other | Attending: Cardiology | Admitting: Cardiology

## 2024-02-15 VITALS — BP 134/88 | HR 70 | Ht 72.0 in | Wt 243.2 lb

## 2024-02-15 DIAGNOSIS — E785 Hyperlipidemia, unspecified: Secondary | ICD-10-CM | POA: Diagnosis not present

## 2024-02-15 DIAGNOSIS — I2510 Atherosclerotic heart disease of native coronary artery without angina pectoris: Secondary | ICD-10-CM

## 2024-02-15 DIAGNOSIS — Z96652 Presence of left artificial knee joint: Secondary | ICD-10-CM | POA: Diagnosis not present

## 2024-02-15 DIAGNOSIS — R531 Weakness: Secondary | ICD-10-CM | POA: Diagnosis not present

## 2024-02-15 DIAGNOSIS — M25662 Stiffness of left knee, not elsewhere classified: Secondary | ICD-10-CM | POA: Diagnosis not present

## 2024-02-15 NOTE — Patient Instructions (Signed)
 Medication Instructions:  Your physician recommends that you continue on your current medications as directed. Please refer to the Current Medication list given to you today.  *If you need a refill on your cardiac medications before your next appointment, please call your pharmacy*  Follow-Up: At [facility], you and your health needs are our priority.  As part of our continuing mission to provide you with exceptional heart care, we have created designated Provider Care Teams.  These Care Teams include your primary Cardiologist (physician) and Advanced Practice Providers (APPs -  Physician Assistants and Nurse Practitioners) who all work together to provide you with the care you need, when you need it.  We recommend signing up for the patient portal called "MyChart".  Sign up information is provided on this After Visit Summary.  MyChart is used to connect with patients for Virtual Visits (Telemedicine).  Patients are able to view lab/test results, encounter notes, upcoming appointments, etc.  Non-urgent messages can be sent to your provider as well.   To learn more about what you can do with MyChart, go to [email or web address].    Your next appointment:   1 year(s)  The format for your next appointment:   In Person  Provider:   Armanda Magic, MD {  Other Instructions   1st Floor: - Lobby - Registration  - Pharmacy  - Lab - Cafe  2nd Floor: - PV Lab - Diagnostic Testing (echo, CT, nuclear med)  3rd Floor: - Vacant  4th Floor: - TCTS (cardiothoracic surgery) - AFib Clinic - Structural Heart Clinic - Vascular Surgery  - Vascular Ultrasound  5th Floor: - HeartCare Cardiology (general and EP) - Clinical Pharmacy for coumadin, hypertension, lipid, weight-loss medications, and med management appointments    Valet parking services will be available as well.

## 2024-02-15 NOTE — Progress Notes (Signed)
 Date:  02/15/2024   ID:  Joe Williams, DOB Mar 04, 1956, MRN [id number] The patient was identified using 2 identifiers.  PCP:  Plotnikov, Georgina Quint, MD   [facility] Providers Cardiologist:  Armanda Magic, MD     Evaluation Performed:  Follow-Up Visit  Chief Complaint:  CAD  History of Present Illness:    Joe Williams is a 68 y.o. male with a hx of HLD, tobacco use, glucose intolerance and family hx of CAD who underwent coronary calcium score to assess future risk for cardiovascular dz. His mom died of an MI in her mid 69's. Coronary calcium score was 242.   He is here today for followup and is doing well.  He denies any chest pain or pressure, SOB, DOE, PND, orthopnea, LE edema (except after his TKR), dizziness, palpitations or syncope. He is compliant with his meds and is tolerating meds with no SE.    Past Medical History:  Diagnosis Date   Agatston coronary artery calcium score between 200 and 399    score 272 on CT   COLONIC POLYPS, HX OF    rectal-hyperplastic only 2007   DYSFUNCTION, SINOATRIAL NODE    chronic sinus brady   GERD    GLUCOSE INTOLERANCE    History of shingles    HYPERLIPIDEMIA    Morbid obesity (HCC)    PLANTAR FASCIITIS    TOBACCO USE, QUIT    Past Surgical History:  Procedure Laterality Date   CHOLECYSTECTOMY N/A 03/27/2023   Procedure: LAPAROSCOPIC CHOLECYSTECTOMY;  Surgeon: Fritzi Mandes, MD;  Location: [facility] OR;  Service: General;  Laterality: N/A;   COLONOSCOPY     KNEE ARTHROSCOPY Bilateral 2019   ROTATOR CUFF REPAIR  1995 (approx)   right -Dr. Bralynn Donado Daniels     Current Meds  Medication Sig   aspirin EC 81 MG tablet Take 81 mg by mouth daily.   cholecalciferol (VITAMIN D) 1000 UNITS tablet Take 1,000 Units by mouth daily.   colesevelam (WELCHOL) 625 MG tablet Take 1-3 tablets once or twice a day to prevent diarrhea   meloxicam (MOBIC) 15 MG tablet TAKE 1 TABLET (15 MG TOTAL) BY MOUTH DAILY.   Menthol, Topical Analgesic, 4 % GEL Apply 1  application  topically as needed (pain). To knees   omeprazole (PRILOSEC) 40 MG capsule Take 1 capsule (40 mg total) by mouth daily.   potassium gluconate 595 (99 K) MG TABS tablet Take 1 tablet by mouth daily.   rosuvastatin (CRESTOR) 10 MG tablet TAKE 1 TABLET BY MOUTH EVERY DAY   tamsulosin (FLOMAX) 0.4 MG CAPS capsule TAKE 1 CAPSULE BY MOUTH EVERY DAY   vitamin C (ASCORBIC ACID) 500 MG tablet Take 500 mg by mouth daily.     Allergies:   Lovastatin, Pravastatin, and Tetracycline   Social History   Tobacco Use   Smoking status: Former   Smokeless tobacco: Never  Substance Use Topics   Alcohol use: Yes    Alcohol/week: 3.0 standard drinks of alcohol    Types: 3 Cans of beer per week    Comment: wknd   Drug use: No     Family Hx: The patient's family history includes Dementia in an other family member; Gallbladder disease in his mother; Heart disease in his mother; Ulcers in his father and sister. There is no history of Colon cancer.  ROS:   Please see the history of present illness.     All other systems reviewed and are negative.   Prior  CV studies:   The following studies were reviewed today:  none  Labs/Other Tests and Data Reviewed:    EKG Interpretation Date/Time:  Thursday February 15 2024 08:14:55 EDT Ventricular Rate:  69 PR Interval:  180 QRS Duration:  74 QT Interval:  384 QTC Calculation: 411 R Axis:   -10  Text Interpretation: Normal sinus rhythm Normal ECG When compared with ECG of 21-Mar-2023 08:46, No significant change was found Confirmed by Armanda Magic ([id number]) on 02/15/2024 8:30:23 AM    Recent Labs: 12/28/2023: ALT 19; BUN 18; Creatinine, Ser 0.88; Hemoglobin 14.1; Platelets 201.0; Potassium 4.4; Sodium 140; TSH 1.39   Recent Lipid Panel Lab Results  Component Value Date/Time   CHOL 105 12/28/2023 07:52 AM   CHOL 118 10/20/2021 08:07 AM   TRIG 98.0 12/28/2023 07:52 AM   HDL 43.50 12/28/2023 07:52 AM   HDL 46 10/20/2021 08:07 AM   CHOLHDL 2  12/28/2023 07:52 AM   LDLCALC 42 12/28/2023 07:52 AM   LDLCALC 55 10/20/2021 08:07 AM    Wt Readings from Last 3 Encounters:  02/15/24 243 lb 3.2 oz (110.3 kg)  01/16/24 245 lb (111.1 kg)  11/01/23 252 lb (114.3 kg)     Risk Assessment/Calculations:          Objective:    Vital Signs:  BP 134/88 (BP Location: Left Arm, Patient Position: Sitting, Cuff Size: Large)   Pulse 70   Ht 6' (1.829 m)   Wt 243 lb 3.2 oz (110.3 kg)   SpO2 98%   BMI 32.98 kg/m    VITAL SIGNS:  reviewed GEN:  no acute distress EYES:  sclerae anicteric, EOMI - Extraocular Movements Intact RESPIRATORY:  normal respiratory effort, symmetric expansion CARDIOVASCULAR:  no peripheral edema SKIN:  no rash, lesions or ulcers. MUSCULOSKELETAL:  no obvious deformities. NEURO:  alert and oriented x 3, no obvious focal deficit PSYCH:  normal affect  ASSESSMENT & PLAN:    1.  Coronary artery calcifications -noted on Chest CT at 272.   -lexiscan myoview 08/2019 with no ischemia -He denies any symptoms -Continue prescription drug management with aspirin 81 mg daily and Crestor 10 mg daily with as needed refills  2.  HLD -LDL goal < 70 -I have personally reviewed and interpreted outside labs performed by patient's PCP which showed LDL 42, HDL 43, triglycerides 98, ALT 19 on 12/28/2023 -Continue prescription drug management Crestor 10 mg daily with as needed refills    Medication Adjustments/Labs and Tests Ordered: Current medicines are reviewed at length with the patient today.  Concerns regarding medicines are outlined above.   Tests Ordered: Orders Placed This Encounter  Procedures   EKG 12-Lead    Medication Changes: No orders of the defined types were placed in this encounter.   Follow Up:  In Person in 1 year(s)  Signed, Armanda Magic, MD  02/15/2024 8:28 AM

## 2024-02-20 DIAGNOSIS — R531 Weakness: Secondary | ICD-10-CM | POA: Diagnosis not present

## 2024-02-20 DIAGNOSIS — Z96652 Presence of left artificial knee joint: Secondary | ICD-10-CM | POA: Diagnosis not present

## 2024-02-20 DIAGNOSIS — M25662 Stiffness of left knee, not elsewhere classified: Secondary | ICD-10-CM | POA: Diagnosis not present

## 2024-02-22 DIAGNOSIS — Z96652 Presence of left artificial knee joint: Secondary | ICD-10-CM | POA: Diagnosis not present

## 2024-02-22 DIAGNOSIS — M25662 Stiffness of left knee, not elsewhere classified: Secondary | ICD-10-CM | POA: Diagnosis not present

## 2024-02-22 DIAGNOSIS — R531 Weakness: Secondary | ICD-10-CM | POA: Diagnosis not present

## 2024-02-27 DIAGNOSIS — M25662 Stiffness of left knee, not elsewhere classified: Secondary | ICD-10-CM | POA: Diagnosis not present

## 2024-02-27 DIAGNOSIS — R531 Weakness: Secondary | ICD-10-CM | POA: Diagnosis not present

## 2024-02-27 DIAGNOSIS — Z96652 Presence of left artificial knee joint: Secondary | ICD-10-CM | POA: Diagnosis not present

## 2024-02-29 DIAGNOSIS — M25662 Stiffness of left knee, not elsewhere classified: Secondary | ICD-10-CM | POA: Diagnosis not present

## 2024-02-29 DIAGNOSIS — R531 Weakness: Secondary | ICD-10-CM | POA: Diagnosis not present

## 2024-02-29 DIAGNOSIS — Z96652 Presence of left artificial knee joint: Secondary | ICD-10-CM | POA: Diagnosis not present

## 2024-03-13 ENCOUNTER — Other Ambulatory Visit: Payer: Self-pay | Admitting: Cardiology

## 2024-03-18 ENCOUNTER — Telehealth: Payer: Self-pay | Admitting: Cardiology

## 2024-03-18 NOTE — Telephone Encounter (Signed)
 Dr. Micael Adas , patient's chart was reviewed for preoperative cardiac evaluation.  He was seen by you on 02/15/2024 and according to protocol, we request that you comment on cardiac risk for upcoming procedure since office visit was less than 2 months ago.    Please route your response to p cv div preop.  Thank you, Gerldine Koch, NP-C 03/18/2024, 1:53 PM

## 2024-03-18 NOTE — Telephone Encounter (Signed)
   Pre-operative Risk Assessment    Patient Name: Joe Williams  DOB: 1956-08-31 MRN: [id number]   Date of last office visit:  Date of next office visit:    Request for Surgical Clearance    Procedure:  Right Total Knee Replacement  Date of Surgery:  05-24-24                                 Surgeon:  Dr Marletta Simmering Group or Practice Name:   Phone number:  [phone] Fax number:  [phone]   Type of Clearance Requested:  Medical and Medicine- noit clear on medicine    Type of Anesthesia:  Spinal   Additional requests/questions:    Signed, Shela Derby   03/18/2024, 8:58 AM

## 2024-03-20 NOTE — Telephone Encounter (Signed)
   Primary Cardiologist: Gaylyn Keas, MD  Chart reviewed as part of pre-operative protocol coverage. Given past medical history and time since last visit, based on ACC/AHA guidelines, Joe Williams would be at acceptable risk for the planned procedure without further cardiovascular testing.   Patient was advised that if he develops new symptoms prior to surgery to contact our office to arrange a follow-up appointment.  He verbalized understanding.  Ideally aspirin  should be continued without interruption, however if the bleeding risk is too great, aspirin  may be held for 5-7 days prior to surgery. Please resume aspirin  post operatively when it is felt to be safe from a bleeding standpoint.   I will route this recommendation to the requesting party via Epic fax function and remove from pre-op pool.  Please call with questions.  Gerldine Koch, NP-C  03/20/2024, 7:33 AM [address], [address] [place], [place] [address] Office [phone] Fax [phone]

## 2024-04-02 NOTE — Telephone Encounter (Signed)
 Reason for CRM: morgan from the [facility] called stating they received the clearance form back but they need clinical notes for the patient last visit CB [phone]

## 2024-04-19 NOTE — Telephone Encounter (Signed)
 Paperwork has been faxed over a/w office notes as well.

## 2024-05-08 DIAGNOSIS — M25561 Pain in right knee: Secondary | ICD-10-CM | POA: Diagnosis not present

## 2024-05-08 DIAGNOSIS — M1711 Unilateral primary osteoarthritis, right knee: Secondary | ICD-10-CM | POA: Diagnosis not present

## 2024-05-09 ENCOUNTER — Other Ambulatory Visit: Payer: Self-pay | Admitting: Internal Medicine

## 2024-05-20 ENCOUNTER — Ambulatory Visit (INDEPENDENT_AMBULATORY_CARE_PROVIDER_SITE_OTHER): Admitting: Family Medicine

## 2024-05-20 ENCOUNTER — Other Ambulatory Visit: Payer: Self-pay | Admitting: Internal Medicine

## 2024-05-20 VITALS — BP 138/90 | HR 64 | Temp 97.9°F | Ht 72.0 in | Wt 249.4 lb

## 2024-05-20 DIAGNOSIS — E739 Lactose intolerance, unspecified: Secondary | ICD-10-CM

## 2024-05-20 DIAGNOSIS — E785 Hyperlipidemia, unspecified: Secondary | ICD-10-CM

## 2024-05-20 DIAGNOSIS — Z7689 Persons encountering health services in other specified circumstances: Secondary | ICD-10-CM

## 2024-05-20 NOTE — Progress Notes (Signed)
 Subjective:    Patient ID: Joe Williams, male    DOB: December 02, 1955, 68 y.o.   MRN: [id number]  HPI  Patient is a very pleasant 68 year old Caucasian gentleman here today to establish care.  Past medical history is significant for an elevated coronary artery calcium  score seen in 2020 on a CT scan of his chest.  He is currently on rosuvastatin  with a goal LDL cholesterol less than 55.  This was last checked in February and was less than 55.  He also takes aspirin .  He has a longstanding history of prediabetes for decades.  His last hemoglobin A1c was 6.1.  His blood pressure today is borderline however he states that he has never had elevated blood pressure in the past.  He is currently taking meloxicam  for pain in his right knee however he is scheduled for knee replacement surgery in the near future.  I did recently as well as a cholecystectomy last year.  His PSA was checked in February and was normal.  His colonoscopy was performed in 2018 and is not due again until 2028.  He is due for Pneumovax 23.  Shingrix is up-to-date.  He is due for a tetanus shot. Past Medical History:  Diagnosis Date   Agatston coronary artery calcium  score between 200 and 399    score 272 on CT   COLONIC POLYPS, HX OF    rectal-hyperplastic only 2007   DYSFUNCTION, SINOATRIAL NODE    chronic sinus brady   GERD    GLUCOSE INTOLERANCE    History of shingles    HYPERLIPIDEMIA    Morbid obesity (HCC)    PLANTAR FASCIITIS    TOBACCO USE, QUIT    Past Surgical History:  Procedure Laterality Date   CHOLECYSTECTOMY N/A 03/27/2023   Procedure: LAPAROSCOPIC CHOLECYSTECTOMY;  Surgeon: Dasie Leonor CROME, MD;  Location: [facility] OR;  Service: General;  Laterality: N/A;   COLONOSCOPY     KNEE ARTHROSCOPY Bilateral 2019   ROTATOR CUFF REPAIR  1995 (approx)   right -Dr. Liam   Current Outpatient Medications on File Prior to Visit  Medication Sig Dispense Refill   aspirin  EC 81 MG tablet Take 81 mg by mouth daily.      cholecalciferol  (VITAMIN D ) 1000 UNITS tablet Take 1,000 Units by mouth daily.     colesevelam  (WELCHOL ) 625 MG tablet Take 1-3 tablets once or twice a day to prevent diarrhea 180 tablet 3   Menthol, Topical Analgesic, 4 % GEL Apply 1 application  topically as needed (pain). To knees     omeprazole  (PRILOSEC) 40 MG capsule TAKE 1 CAPSULE BY MOUTH DAILY 90 capsule 2   potassium gluconate 595 (99 K) MG TABS tablet Take 1 tablet by mouth daily.     rosuvastatin  (CRESTOR ) 10 MG tablet TAKE 1 TABLET BY MOUTH EVERY DAY 90 tablet 3   tamsulosin  (FLOMAX ) 0.4 MG CAPS capsule TAKE 1 CAPSULE BY MOUTH EVERY DAY 90 capsule 3   vitamin C (ASCORBIC ACID ) 500 MG tablet Take 500 mg by mouth daily.     No current facility-administered medications on file prior to visit.   Allergies  Allergen Reactions   Lovastatin     achy   Pravastatin      ??hepatitis   Tetracycline    Social History   Socioeconomic History   Marital status: Married    Spouse name: Not on file   Number of children: 1   Years of education: Not on file   Highest education  level: Associate degree: occupational, Scientist, product/process development, or vocational program  Occupational History   Occupation: Warehouse manager: UNEMPLOYED  Tobacco Use   Smoking status: Former   Smokeless tobacco: Never  Substance and Sexual Activity   Alcohol use: Yes    Alcohol/week: 3.0 standard drinks of alcohol    Types: 3 Cans of beer per week    Comment: wknd   Drug use: No   Sexual activity: Yes  Other Topics Concern   Not on file  Social History Narrative   Not on file   Social Drivers of Health   Financial Resource Strain: Low Risk  (05/19/2024)   Overall Financial Resource Strain (CARDIA)    Difficulty of Paying Living Expenses: Not hard at all  Food Insecurity: No Food Insecurity (05/19/2024)   Hunger Vital Sign    Worried About Running Out of Food in the Last Year: Never true    Ran Out of Food in the Last Year: Never true  Transportation  Needs: No Transportation Needs (05/19/2024)   PRAPARE - Administrator, Civil Service (Medical): No    Lack of Transportation (Non-Medical): No  Physical Activity: Sufficiently Active (05/19/2024)   Exercise Vital Sign    Days of Exercise per Week: 6 days    Minutes of Exercise per Session: 60 min  Stress: No Stress Concern Present (05/19/2024)   [employer] of Occupational Health - Occupational Stress Questionnaire    Feeling of Stress: Not at all  Social Connections: Moderately Isolated (05/19/2024)   Social Connection and Isolation Panel    Frequency of Communication with Friends and Family: More than three times a week    Frequency of Social Gatherings with Friends and Family: Twice a week    Attends Religious Services: Patient declined    Database administrator or Organizations: No    Attends Engineer, structural: Not on file    Marital Status: Married  Catering manager Violence: Not on file   Family History  Problem Relation Age of Onset   Gallbladder disease Mother    Heart disease Mother        MI, irreg rhthm on coumadin   Ulcers Father    Ulcers Sister    Dementia Other        Multiple with dementia but lived to old age   Colon cancer Neg Hx      Review of Systems  All other systems reviewed and are negative.      Objective:   Physical Exam Vitals reviewed.  Constitutional:      General: He is not in acute distress.    Appearance: Normal appearance. He is normal weight. He is not toxic-appearing.  HENT:     Head: Normocephalic.     Right Ear: Tympanic membrane and ear canal normal.     Left Ear: Tympanic membrane and ear canal normal.     Nose: No congestion or rhinorrhea.     Mouth/Throat:     Pharynx: Oropharynx is clear. No oropharyngeal exudate or posterior oropharyngeal erythema.  Eyes:     Conjunctiva/sclera: Conjunctivae normal.     Pupils: Pupils are equal, round, and reactive to light.  Neck:     Vascular: No carotid bruit.   Cardiovascular:     Rate and Rhythm: Normal rate and regular rhythm.     Pulses: Normal pulses.     Heart sounds: Normal heart sounds. No murmur heard.    No friction rub.  No gallop.  Pulmonary:     Effort: Pulmonary effort is normal. No respiratory distress.     Breath sounds: Normal breath sounds. No stridor. No wheezing, rhonchi or rales.  Abdominal:     General: Abdomen is flat. Bowel sounds are normal. There is no distension.     Tenderness: There is no abdominal tenderness. There is no guarding or rebound.  Musculoskeletal:     Cervical back: No rigidity.     Right lower leg: No edema.     Left lower leg: No edema.  Lymphadenopathy:     Cervical: No cervical adenopathy.  Skin:    General: Skin is warm.     Coloration: Skin is not jaundiced or pale.     Findings: No bruising, erythema, lesion or rash.  Neurological:     General: No focal deficit present.     Mental Status: He is alert and oriented to person, place, and time. Mental status is at baseline.     Cranial Nerves: No cranial nerve deficit.     Motor: No weakness.     Coordination: Coordination normal.     Gait: Gait normal.     Deep Tendon Reflexes: Reflexes normal.  Psychiatric:        Mood and Affect: Mood normal.        Thought Content: Thought content normal.        Judgment: Judgment normal.           Assessment & Plan:  GLUCOSE INTOLERANCE  Hyperlipidemia, unspecified hyperlipidemia type  Establishing care with new doctor, encounter for Recommended the patient get Pneumovax 23.  Also recommended a tetanus shot.  Colonoscopy and prostate cancer screening are up-to-date.  Recommended returning in September for fasting labs including a hemoglobin A1c to monitor his prediabetes.  Goal LDL cholesterol is less than 55.  Blood pressure today is borderline.  Recommended the patient monitor this.  I would like him to stop using NSAIDs after his surgery if possible

## 2024-05-24 DIAGNOSIS — Z96651 Presence of right artificial knee joint: Secondary | ICD-10-CM | POA: Diagnosis not present

## 2024-05-24 DIAGNOSIS — M25761 Osteophyte, right knee: Secondary | ICD-10-CM | POA: Diagnosis not present

## 2024-05-24 DIAGNOSIS — G8918 Other acute postprocedural pain: Secondary | ICD-10-CM | POA: Diagnosis not present

## 2024-05-24 DIAGNOSIS — M21161 Varus deformity, not elsewhere classified, right knee: Secondary | ICD-10-CM | POA: Diagnosis not present

## 2024-05-24 DIAGNOSIS — M1711 Unilateral primary osteoarthritis, right knee: Secondary | ICD-10-CM | POA: Diagnosis not present

## 2024-05-27 DIAGNOSIS — R531 Weakness: Secondary | ICD-10-CM | POA: Diagnosis not present

## 2024-05-27 DIAGNOSIS — M25661 Stiffness of right knee, not elsewhere classified: Secondary | ICD-10-CM | POA: Diagnosis not present

## 2024-05-27 DIAGNOSIS — Z96651 Presence of right artificial knee joint: Secondary | ICD-10-CM | POA: Diagnosis not present

## 2024-05-31 DIAGNOSIS — Z96651 Presence of right artificial knee joint: Secondary | ICD-10-CM | POA: Diagnosis not present

## 2024-05-31 DIAGNOSIS — M25661 Stiffness of right knee, not elsewhere classified: Secondary | ICD-10-CM | POA: Diagnosis not present

## 2024-05-31 DIAGNOSIS — R531 Weakness: Secondary | ICD-10-CM | POA: Diagnosis not present

## 2024-06-04 DIAGNOSIS — M25661 Stiffness of right knee, not elsewhere classified: Secondary | ICD-10-CM | POA: Diagnosis not present

## 2024-06-04 DIAGNOSIS — Z96651 Presence of right artificial knee joint: Secondary | ICD-10-CM | POA: Diagnosis not present

## 2024-06-04 DIAGNOSIS — R531 Weakness: Secondary | ICD-10-CM | POA: Diagnosis not present

## 2024-06-07 DIAGNOSIS — M25661 Stiffness of right knee, not elsewhere classified: Secondary | ICD-10-CM | POA: Diagnosis not present

## 2024-06-07 DIAGNOSIS — R531 Weakness: Secondary | ICD-10-CM | POA: Diagnosis not present

## 2024-06-07 DIAGNOSIS — Z96651 Presence of right artificial knee joint: Secondary | ICD-10-CM | POA: Diagnosis not present

## 2024-06-11 DIAGNOSIS — Z96651 Presence of right artificial knee joint: Secondary | ICD-10-CM | POA: Diagnosis not present

## 2024-06-11 DIAGNOSIS — M25661 Stiffness of right knee, not elsewhere classified: Secondary | ICD-10-CM | POA: Diagnosis not present

## 2024-06-11 DIAGNOSIS — R531 Weakness: Secondary | ICD-10-CM | POA: Diagnosis not present

## 2024-06-13 DIAGNOSIS — M25661 Stiffness of right knee, not elsewhere classified: Secondary | ICD-10-CM | POA: Diagnosis not present

## 2024-06-13 DIAGNOSIS — Z96651 Presence of right artificial knee joint: Secondary | ICD-10-CM | POA: Diagnosis not present

## 2024-06-13 DIAGNOSIS — R531 Weakness: Secondary | ICD-10-CM | POA: Diagnosis not present

## 2024-06-17 DIAGNOSIS — Z96651 Presence of right artificial knee joint: Secondary | ICD-10-CM | POA: Diagnosis not present

## 2024-06-17 DIAGNOSIS — R531 Weakness: Secondary | ICD-10-CM | POA: Diagnosis not present

## 2024-06-17 DIAGNOSIS — M25661 Stiffness of right knee, not elsewhere classified: Secondary | ICD-10-CM | POA: Diagnosis not present

## 2024-06-20 DIAGNOSIS — Z96651 Presence of right artificial knee joint: Secondary | ICD-10-CM | POA: Diagnosis not present

## 2024-06-20 DIAGNOSIS — R531 Weakness: Secondary | ICD-10-CM | POA: Diagnosis not present

## 2024-06-20 DIAGNOSIS — M25661 Stiffness of right knee, not elsewhere classified: Secondary | ICD-10-CM | POA: Diagnosis not present

## 2024-06-24 DIAGNOSIS — Z96651 Presence of right artificial knee joint: Secondary | ICD-10-CM | POA: Diagnosis not present

## 2024-06-24 DIAGNOSIS — M25661 Stiffness of right knee, not elsewhere classified: Secondary | ICD-10-CM | POA: Diagnosis not present

## 2024-06-24 DIAGNOSIS — R531 Weakness: Secondary | ICD-10-CM | POA: Diagnosis not present

## 2024-06-25 ENCOUNTER — Telehealth: Payer: Self-pay

## 2024-06-25 NOTE — Telephone Encounter (Signed)
 Copied from CRM [id number]. Topic: Clinical - Request for Lab/Test Order >> Jun 25, 2024  9:59 AM Selinda RAMAN wrote: Reason for CRM: The patient called in to schedule a lab appointment to get updated blood work drawn. He thought this had already been scheduled but I assisted him with scheduling after speaking with Nanette. Please make sure orders are in the system as he is scheduled to come in this Friday August 15th at 9:15 and has a follow up office visit next Friday August 21st to go over them. He already had his yearly physical this year with his previous provider and so he just wanted to get the labs drawn and go over them in a regular office visit. Please assist patient further.

## 2024-06-28 ENCOUNTER — Other Ambulatory Visit

## 2024-06-28 DIAGNOSIS — E785 Hyperlipidemia, unspecified: Secondary | ICD-10-CM | POA: Diagnosis not present

## 2024-06-28 DIAGNOSIS — Z87891 Personal history of nicotine dependence: Secondary | ICD-10-CM

## 2024-06-28 DIAGNOSIS — K8021 Calculus of gallbladder without cholecystitis with obstruction: Secondary | ICD-10-CM

## 2024-06-28 DIAGNOSIS — R739 Hyperglycemia, unspecified: Secondary | ICD-10-CM

## 2024-06-28 DIAGNOSIS — E739 Lactose intolerance, unspecified: Secondary | ICD-10-CM | POA: Diagnosis not present

## 2024-06-28 DIAGNOSIS — R7989 Other specified abnormal findings of blood chemistry: Secondary | ICD-10-CM

## 2024-06-28 DIAGNOSIS — B179 Acute viral hepatitis, unspecified: Secondary | ICD-10-CM

## 2024-06-29 LAB — COMPLETE METABOLIC PANEL WITHOUT GFR
AG Ratio: 2 (calc) (ref 1.0–2.5)
ALT: 15 U/L (ref 9–46)
AST: 14 U/L (ref 10–35)
Albumin: 4.2 g/dL (ref 3.6–5.1)
Alkaline phosphatase (APISO): 34 U/L — ABNORMAL LOW (ref 35–144)
BUN: 21 mg/dL (ref 7–25)
CO2: 29 mmol/L (ref 20–32)
Calcium: 9.1 mg/dL (ref 8.6–10.3)
Chloride: 102 mmol/L (ref 98–110)
Creat: 0.99 mg/dL (ref 0.70–1.35)
Globulin: 2.1 g/dL (ref 1.9–3.7)
Glucose, Bld: 110 mg/dL — ABNORMAL HIGH (ref 65–99)
Potassium: 4.8 mmol/L (ref 3.5–5.3)
Sodium: 138 mmol/L (ref 135–146)
Total Bilirubin: 0.7 mg/dL (ref 0.2–1.2)
Total Protein: 6.3 g/dL (ref 6.1–8.1)

## 2024-06-29 LAB — LIPID PANEL
Cholesterol: 114 mg/dL (ref <200)
HDL: 43 mg/dL (ref >=40)
LDL Cholesterol (Calc): 52 mg/dL
Non-HDL Cholesterol (Calc): 71 mg/dL (ref <130)
Total CHOL/HDL Ratio: 2.7 (calc) (ref <5.0)
Triglycerides: 104 mg/dL (ref <150)

## 2024-06-29 LAB — CBC WITH DIFFERENTIAL/PLATELET
Absolute Lymphocytes: 1382 {cells}/uL (ref 850–3900)
Absolute Monocytes: 393 {cells}/uL (ref 200–950)
Basophils Absolute: 61 {cells}/uL (ref 0–200)
Basophils Relative: 1.2 %
Eosinophils Absolute: 260 {cells}/uL (ref 15–500)
Eosinophils Relative: 5.1 %
HCT: 41.7 % (ref 38.5–50.0)
Hemoglobin: 13.4 g/dL (ref 13.2–17.1)
MCH: 29.3 pg (ref 27.0–33.0)
MCHC: 32.1 g/dL (ref 32.0–36.0)
MCV: 91 fL (ref 80.0–100.0)
MPV: 10.8 fL (ref 7.5–12.5)
Monocytes Relative: 7.7 %
Neutro Abs: 3004 {cells}/uL (ref 1500–7800)
Neutrophils Relative %: 58.9 %
Platelets: 191 Thousand/uL (ref 140–400)
RBC: 4.58 Million/uL (ref 4.20–5.80)
RDW: 13 % (ref 11.0–15.0)
Total Lymphocyte: 27.1 %
WBC: 5.1 Thousand/uL (ref 3.8–10.8)

## 2024-06-29 LAB — HEMOGLOBIN A1C
Hgb A1c MFr Bld: 6 % — ABNORMAL HIGH (ref <5.7)
Mean Plasma Glucose: 126 mg/dL
eAG (mmol/L): 7 mmol/L

## 2024-07-01 ENCOUNTER — Ambulatory Visit: Payer: Self-pay | Admitting: Family Medicine

## 2024-07-05 ENCOUNTER — Encounter: Payer: Self-pay | Admitting: Family Medicine

## 2024-07-05 ENCOUNTER — Ambulatory Visit (INDEPENDENT_AMBULATORY_CARE_PROVIDER_SITE_OTHER): Admitting: Family Medicine

## 2024-07-05 VITALS — BP 136/82 | HR 70 | Temp 97.6°F | Ht 72.0 in | Wt 254.0 lb

## 2024-07-05 DIAGNOSIS — R7303 Prediabetes: Secondary | ICD-10-CM

## 2024-07-05 DIAGNOSIS — E785 Hyperlipidemia, unspecified: Secondary | ICD-10-CM

## 2024-07-05 DIAGNOSIS — Z23 Encounter for immunization: Secondary | ICD-10-CM

## 2024-07-05 NOTE — Progress Notes (Signed)
 Pt. Informed by phone. Has already scheduled his lab visit and his physical for the next year.

## 2024-07-05 NOTE — Progress Notes (Signed)
 Subjective:    Patient ID: Joe Williams, male    DOB: [birth year], 68 y.o.   MRN: [id number]  HPI  Patient is a very pleasant 68 year old Caucasian gentleman here today for follow up.  Past medical history is significant for an elevated coronary artery calcium  score seen in 2020 on a CT scan of his chest.  He is currently on rosuvastatin  with a goal LDL cholesterol less than 55.  He also takes aspirin .  He has a longstanding history of prediabetes for decades.  Patient is due for a tetanus shot.  He would like to get this today if his insurance will cover it.  Otherwise has been doing well.  He is currently taking meloxicam  for pain in his knee.  He reports pain under his patella in his left knee.  We discussed the possible renal toxicity and the increased risk of cardiovascular disease but at the present time he feels that the positive aspects of the medicine outweigh the slight risk that we discussed.  He is here today to review his most recent lab work Lab on 06/28/2024  Component Date Value Ref Range Status   WBC 06/28/2024 5.1  3.8 - 10.8 Thousand/uL Final   RBC 06/28/2024 4.58  4.20 - 5.80 Million/uL Final   Hemoglobin 06/28/2024 13.4  13.2 - 17.1 g/dL Final   HCT 91/84/7974 41.7  38.5 - 50.0 % Final   MCV 06/28/2024 91.0  80.0 - 100.0 fL Final   MCH 06/28/2024 29.3  27.0 - 33.0 pg Final   MCHC 06/28/2024 32.1  32.0 - 36.0 g/dL Final   Comment: For adults, a slight decrease in the calculated MCHC value (in the range of 30 to 32 g/dL) is most likely not clinically significant; however, it should be interpreted with caution in correlation with other red cell parameters and the patient's clinical condition.    RDW 06/28/2024 13.0  11.0 - 15.0 % Final   Platelets 06/28/2024 191  140 - 400 Thousand/uL Final   MPV 06/28/2024 10.8  7.5 - 12.5 fL Final   Neutro Abs 06/28/2024 3,004  1,500 - 7,800 cells/uL Final   Absolute Lymphocytes 06/28/2024 1,382  850 - 3,900 cells/uL Final   Absolute  Monocytes 06/28/2024 393  200 - 950 cells/uL Final   Eosinophils Absolute 06/28/2024 260  15 - 500 cells/uL Final   Basophils Absolute 06/28/2024 61  0 - 200 cells/uL Final   Neutrophils Relative % 06/28/2024 58.9  % Final   Total Lymphocyte 06/28/2024 27.1  % Final   Monocytes Relative 06/28/2024 7.7  % Final   Eosinophils Relative 06/28/2024 5.1  % Final   Basophils Relative 06/28/2024 1.2  % Final   Hgb A1c MFr Bld 06/28/2024 6.0 (H)  <5.7 % Final   Comment: For someone without known diabetes, a hemoglobin  A1c value between 5.7% and 6.4% is consistent with prediabetes and should be confirmed with a  follow-up test. . For someone with known diabetes, a value <7% indicates that their diabetes is well controlled. A1c targets should be individualized based on duration of diabetes, age, comorbid conditions, and other considerations. . This assay result is consistent with an increased risk of diabetes. . Currently, no consensus exists regarding use of hemoglobin A1c for diagnosis of diabetes for children. .    Mean Plasma Glucose 06/28/2024 126  mg/dL Final   eAG (mmol/L) 91/84/7974 7.0  mmol/L Final   Glucose, Bld 06/28/2024 110 (H)  65 - 99 mg/dL Final  Comment: .            Fasting reference interval . For someone without known diabetes, a glucose value between 100 and 125 mg/dL is consistent with prediabetes and should be confirmed with a follow-up test. .    BUN 06/28/2024 21  7 - 25 mg/dL Final   Creat 91/84/7974 0.99  0.70 - 1.35 mg/dL Final   BUN/Creatinine Ratio 06/28/2024 SEE NOTE:  6 - 22 (calc) Final   Comment:    Not Reported: BUN and Creatinine are within    reference range. .    Sodium 06/28/2024 138  135 - 146 mmol/L Final   Potassium 06/28/2024 4.8  3.5 - 5.3 mmol/L Final   Chloride 06/28/2024 102  98 - 110 mmol/L Final   CO2 06/28/2024 29  20 - 32 mmol/L Final   Calcium  06/28/2024 9.1  8.6 - 10.3 mg/dL Final   Total Protein 91/84/7974 6.3  6.1 - 8.1  g/dL Final   Albumin 91/84/7974 4.2  3.6 - 5.1 g/dL Final   Globulin 91/84/7974 2.1  1.9 - 3.7 g/dL (calc) Final   AG Ratio 06/28/2024 2.0  1.0 - 2.5 (calc) Final   Total Bilirubin 06/28/2024 0.7  0.2 - 1.2 mg/dL Final   Alkaline phosphatase (APISO) 06/28/2024 34 (L)  35 - 144 U/L Final   AST 06/28/2024 14  10 - 35 U/L Final   ALT 06/28/2024 15  9 - 46 U/L Final   Cholesterol 06/28/2024 114  <200 mg/dL Final   HDL 91/84/7974 43  > OR = 40 mg/dL Final   Triglycerides 91/84/7974 104  <150 mg/dL Final   LDL Cholesterol (Calc) 06/28/2024 52  mg/dL (calc) Final   Comment: Reference range: <100 . Desirable range <100 mg/dL for primary prevention;   <70 mg/dL for patients with CHD or diabetic patients  with > or = 2 CHD risk factors. SABRA LDL-C is now calculated using the Martin-Hopkins  calculation, which is a validated novel method providing  better accuracy than the Friedewald equation in the  estimation of LDL-C.  Gladis APPLETHWAITE et al. SANDREA. 7986;689(80): 2061-2068  (http://education.[facility].com/faq/FAQ164)    Total CHOL/HDL Ratio 06/28/2024 2.7  <4.9 (calc) Final   Non-HDL Cholesterol (Calc) 06/28/2024 71  <130 mg/dL (calc) Final   Comment: For patients with diabetes plus 1 major ASCVD risk  factor, treating to a non-HDL-C goal of <100 mg/dL  (LDL-C of <29 mg/dL) is considered a therapeutic  option.    Hemoglobin A1c is well-controlled at 6.  His LDL cholesterol is outstanding.  The patient denies any chest pain or shortness of breath or dyspnea on exertion.  He has been monitoring his blood pressure at home and the vast majority are well below 140 systolic  Past Medical History:  Diagnosis Date   Agatston coronary artery calcium  score between 200 and 399    score 272 on CT   COLONIC POLYPS, HX OF    rectal-hyperplastic only 2007   DYSFUNCTION, SINOATRIAL NODE    chronic sinus brady   GERD    GLUCOSE INTOLERANCE    History of shingles    HYPERLIPIDEMIA    Morbid obesity  (HCC)    PLANTAR FASCIITIS    TOBACCO USE, QUIT    Past Surgical History:  Procedure Laterality Date   CHOLECYSTECTOMY N/A 03/27/2023   Procedure: LAPAROSCOPIC CHOLECYSTECTOMY;  Surgeon: Dasie Leonor CROME, MD;  Location: [facility] OR;  Service: General;  Laterality: N/A;   COLONOSCOPY     KNEE ARTHROSCOPY  Bilateral 2019   ROTATOR CUFF REPAIR  1995 (approx)   right -Dr. Liam   Current Outpatient Medications on File Prior to Visit  Medication Sig Dispense Refill   aspirin  EC 81 MG tablet Take 81 mg by mouth daily.     cholecalciferol  (VITAMIN D ) 1000 UNITS tablet Take 1,000 Units by mouth daily.     colesevelam  (WELCHOL ) 625 MG tablet Take 1-3 tablets once or twice a day to prevent diarrhea 180 tablet 3   meloxicam  (MOBIC ) 15 MG tablet TAKE 1 TABLET BY MOUTH DAILY 30 tablet 1   omeprazole  (PRILOSEC) 40 MG capsule TAKE 1 CAPSULE BY MOUTH DAILY 90 capsule 2   potassium gluconate 595 (99 K) MG TABS tablet Take 1 tablet by mouth daily.     rosuvastatin  (CRESTOR ) 10 MG tablet TAKE 1 TABLET BY MOUTH EVERY DAY 90 tablet 3   tamsulosin  (FLOMAX ) 0.4 MG CAPS capsule TAKE 1 CAPSULE BY MOUTH EVERY DAY 90 capsule 3   vitamin C (ASCORBIC ACID ) 500 MG tablet Take 500 mg by mouth daily.     No current facility-administered medications on file prior to visit.   Allergies  Allergen Reactions   Lovastatin     achy   Pravastatin      ??hepatitis   Tetracycline    Social History   Socioeconomic History   Marital status: Married    Spouse name: Not on file   Number of children: 1   Years of education: Not on file   Highest education level: Associate degree: occupational, Scientist, product/process development, or vocational program  Occupational History   Occupation: Warehouse manager: UNEMPLOYED  Tobacco Use   Smoking status: Former   Smokeless tobacco: Never  Substance and Sexual Activity   Alcohol use: Yes    Alcohol/week: 3.0 standard drinks of alcohol    Types: 3 Cans of beer per week    Comment: wknd    Drug use: No   Sexual activity: Yes  Other Topics Concern   Not on file  Social History Narrative   Not on file   Social Drivers of Health   Financial Resource Strain: Low Risk  (05/19/2024)   Overall Financial Resource Strain (CARDIA)    Difficulty of Paying Living Expenses: Not hard at all  Food Insecurity: No Food Insecurity (05/19/2024)   Hunger Vital Sign    Worried About Running Out of Food in the Last Year: Never true    Ran Out of Food in the Last Year: Never true  Transportation Needs: No Transportation Needs (05/19/2024)   PRAPARE - Administrator, Civil Service (Medical): No    Lack of Transportation (Non-Medical): No  Physical Activity: Sufficiently Active (05/19/2024)   Exercise Vital Sign    Days of Exercise per Week: 6 days    Minutes of Exercise per Session: 60 min  Stress: No Stress Concern Present (05/19/2024)   [employer] of Occupational Health - Occupational Stress Questionnaire    Feeling of Stress: Not at all  Social Connections: Moderately Isolated (05/19/2024)   Social Connection and Isolation Panel    Frequency of Communication with Friends and Family: More than three times a week    Frequency of Social Gatherings with Friends and Family: Twice a week    Attends Religious Services: Patient declined    Database administrator or Organizations: No    Attends Engineer, structural: Not on file    Marital Status: Married  Catering manager Violence:  Not on file   Family History  Problem Relation Age of Onset   Gallbladder disease Mother    Heart disease Mother        MI, irreg rhthm on coumadin   Ulcers Father    Ulcers Sister    Dementia Other        Multiple with dementia but lived to old age   Colon cancer Neg Hx      Review of Systems  All other systems reviewed and are negative.      Objective:   Physical Exam Vitals reviewed.  Constitutional:      General: He is not in acute distress.    Appearance: Normal appearance.  He is normal weight. He is not toxic-appearing.  HENT:     Head: Normocephalic.  Neck:     Vascular: No carotid bruit.  Cardiovascular:     Rate and Rhythm: Normal rate and regular rhythm.     Pulses: Normal pulses.     Heart sounds: Normal heart sounds. No murmur heard.    No friction rub. No gallop.  Pulmonary:     Effort: Pulmonary effort is normal. No respiratory distress.     Breath sounds: Normal breath sounds. No stridor. No wheezing, rhonchi or rales.  Abdominal:     General: Abdomen is flat. Bowel sounds are normal. There is no distension.     Tenderness: There is no abdominal tenderness. There is no guarding or rebound.  Musculoskeletal:     Right lower leg: No edema.     Left lower leg: No edema.  Skin:    General: Skin is warm.     Coloration: Skin is not jaundiced or pale.     Findings: No bruising, erythema, lesion or rash.  Neurological:     General: No focal deficit present.     Mental Status: He is alert and oriented to person, place, and time. Mental status is at baseline.     Cranial Nerves: No cranial nerve deficit.     Motor: No weakness.     Coordination: Coordination normal.     Gait: Gait normal.     Deep Tendon Reflexes: Reflexes normal.  Psychiatric:        Mood and Affect: Mood normal.        Thought Content: Thought content normal.        Judgment: Judgment normal.           Assessment & Plan:  Hyperlipidemia, unspecified hyperlipidemia type  Prediabetes Cholesterol remains outstanding and blood pressure remains well-controlled.  His prediabetes is stable.  Therefore we will not make any changes in his medications at this time.  I encouraged him to continue to monitor his blood pressure and to notify me if his blood pressure rises greater than 140/90.  Otherwise I would recheck his lab work in 6 months but I will be happy to see him sooner if a problem arises.  I recommended that he try to wean down on the meloxicam  if possible.

## 2024-07-16 DIAGNOSIS — H5203 Hypermetropia, bilateral: Secondary | ICD-10-CM | POA: Diagnosis not present

## 2024-07-18 ENCOUNTER — Ambulatory Visit: Admitting: Internal Medicine

## 2024-07-22 ENCOUNTER — Other Ambulatory Visit

## 2024-08-07 ENCOUNTER — Ambulatory Visit

## 2024-08-07 VITALS — Ht 72.0 in | Wt 254.0 lb

## 2024-08-07 DIAGNOSIS — Z0000 Encounter for general adult medical examination without abnormal findings: Secondary | ICD-10-CM

## 2024-08-07 NOTE — Progress Notes (Signed)
 Subjective:   Joe Williams is a 68 y.o. who presents for a Medicare Wellness preventive visit.  As a reminder, Annual Wellness Visits don't include a physical exam, and some assessments may be limited, especially if this visit is performed virtually. We may recommend an in-person follow-up visit with your provider if needed.  Visit Complete: Virtual I connected with  Joe Williams on 08/07/24 by a audio enabled telemedicine application and verified that I am speaking with the correct person using two identifiers.  Patient Location: Home  Provider Location: Home Office  I discussed the limitations of evaluation and management by telemedicine. The patient expressed understanding and agreed to proceed.  Vital Signs: Because this visit was a virtual/telehealth visit, some criteria may be missing or patient reported. Any vitals not documented were not able to be obtained and vitals that have been documented are patient reported.  VideoDeclined- This patient declined Librarian, academic. Therefore the visit was completed with audio only.  Persons Participating in Visit: Patient.  AWV Questionnaire: No: Patient Medicare AWV questionnaire was not completed prior to this visit.  Cardiac Risk Factors include: advanced age (>8men, >79 women);male gender;dyslipidemia     Objective:    Today's Vitals   08/07/24 0900  Weight: 254 lb (115.2 kg)  Height: 6' (1.829 m)   Body mass index is 34.45 kg/m.     08/07/2024    9:04 AM 03/27/2023    7:34 AM 03/21/2023    8:16 AM 05/02/2021   10:00 PM 11/29/2016    9:03 AM  Advanced Directives  Does Patient Have a Medical Advance Directive? No Yes Yes No Yes   Type of Special educational needs teacher of [place];Living will Healthcare Power of [place];Living will  Healthcare Power of [place];Living will  Does patient want to make changes to medical advance directive?  No - Patient declined No - Patient declined     Copy of Healthcare Power of Attorney in Chart?  No - copy requested No - copy requested    Would patient like information on creating a medical advance directive? Yes (MAU/Ambulatory/Procedural Areas - Information given)   No - Patient declined      Data saved with a previous flowsheet row definition    Current Medications (verified) Outpatient Encounter Medications as of 08/07/2024  Medication Sig   aspirin  EC 81 MG tablet Take 81 mg by mouth daily.   cholecalciferol  (VITAMIN D ) 1000 UNITS tablet Take 1,000 Units by mouth daily.   colesevelam  (WELCHOL ) 625 MG tablet Take 1-3 tablets once or twice a day to prevent diarrhea   meloxicam  (MOBIC ) 15 MG tablet TAKE 1 TABLET BY MOUTH DAILY   omeprazole  (PRILOSEC) 40 MG capsule TAKE 1 CAPSULE BY MOUTH DAILY   potassium gluconate 595 (99 K) MG TABS tablet Take 1 tablet by mouth daily. (Patient taking differently: Take 1 tablet by mouth daily. Takes during summertime when sweating more at work.)   rosuvastatin  (CRESTOR ) 10 MG tablet TAKE 1 TABLET BY MOUTH EVERY DAY   tamsulosin  (FLOMAX ) 0.4 MG CAPS capsule TAKE 1 CAPSULE BY MOUTH EVERY DAY   vitamin C (ASCORBIC ACID ) 500 MG tablet Take 500 mg by mouth daily.   No facility-administered encounter medications on file as of 08/07/2024.    Allergies (verified) Lovastatin, Pravastatin , and Tetracycline   History: Past Medical History:  Diagnosis Date   Agatston coronary artery calcium  score between 200 and 399    score 272 on CT   COLONIC  POLYPS, HX OF    rectal-hyperplastic only 2007   DYSFUNCTION, SINOATRIAL NODE    chronic sinus brady   GERD    GLUCOSE INTOLERANCE    History of shingles    HYPERLIPIDEMIA    Morbid obesity (HCC)    PLANTAR FASCIITIS    TOBACCO USE, QUIT    Past Surgical History:  Procedure Laterality Date   CHOLECYSTECTOMY N/A 03/27/2023   Procedure: LAPAROSCOPIC CHOLECYSTECTOMY;  Surgeon: Dasie Leonor CROME, MD;  Location: [facility] OR;  Service: General;  Laterality: N/A;    COLONOSCOPY     KNEE ARTHROSCOPY Bilateral 2019   ROTATOR CUFF REPAIR  1995 (approx)   right -Dr. Liam   Family History  Problem Relation Age of Onset   Gallbladder disease Mother    Heart disease Mother        MI, irreg rhthm on coumadin   Ulcers Father    Ulcers Sister    Dementia Other        Multiple with dementia but lived to old age   Colon cancer Neg Hx    Social History   Socioeconomic History   Marital status: Married    Spouse name: Not on file   Number of children: 1   Years of education: Not on file   Highest education level: Associate degree: occupational, Scientist, product/process development, or vocational program  Occupational History   Occupation: Warehouse manager: UNEMPLOYED  Tobacco Use   Smoking status: Former   Smokeless tobacco: Never  Substance and Sexual Activity   Alcohol use: Yes    Alcohol/week: 3.0 standard drinks of alcohol    Types: 3 Cans of beer per week    Comment: wknd   Drug use: No   Sexual activity: Yes  Other Topics Concern   Not on file  Social History Narrative   Not on file   Social Drivers of Health   Financial Resource Strain: Low Risk  (08/07/2024)   Overall Financial Resource Strain (CARDIA)    Difficulty of Paying Living Expenses: Not hard at all  Food Insecurity: No Food Insecurity (08/07/2024)   Hunger Vital Sign    Worried About Running Out of Food in the Last Year: Never true    Ran Out of Food in the Last Year: Never true  Transportation Needs: No Transportation Needs (08/07/2024)   PRAPARE - Administrator, Civil Service (Medical): No    Lack of Transportation (Non-Medical): No  Physical Activity: Sufficiently Active (08/07/2024)   Exercise Vital Sign    Days of Exercise per Week: 6 days    Minutes of Exercise per Session: 60 min  Stress: No Stress Concern Present (08/07/2024)   [employer] of Occupational Health - Occupational Stress Questionnaire    Feeling of Stress: Not at all  Social Connections:  Moderately Isolated (08/07/2024)   Social Connection and Isolation Panel    Frequency of Communication with Friends and Family: More than three times a week    Frequency of Social Gatherings with Friends and Family: Twice a week    Attends Religious Services: Patient declined    Database administrator or Organizations: No    Attends Banker Meetings: Never    Marital Status: Married    Tobacco Counseling Counseling given: Not Answered    Clinical Intake:  Pre-visit preparation completed: Yes  Pain : No/denies pain  Diabetes: No  Lab Results  Component Value Date   HGBA1C 6.0 (H) 06/28/2024  HGBA1C 6.1 12/28/2023   HGBA1C 5.9 12/24/2021     How often do you need to have someone help you when you read instructions, pamphlets, or other written materials from your doctor or pharmacy?: 1 - Never  Interpreter Needed?: No  Information entered by :: Charmaine Bloodgood LPN   Activities of Daily Living     08/07/2024    9:00 AM  In your present state of health, do you have any difficulty performing the following activities:  Hearing? 0  Vision? 0  Difficulty concentrating or making decisions? 0  Walking or climbing stairs? 0  Dressing or bathing? 0  Doing errands, shopping? 0  Preparing Food and eating ? N  Using the Toilet? N  In the past six months, have you accidently leaked urine? N  Do you have problems with loss of bowel control? N  Managing your Medications? N  Managing your Finances? N  Housekeeping or managing your Housekeeping? N    Patient Care Team: Duanne Butler DASEN, MD as PCP - General (Family Medicine) Shlomo Wilbert SAUNDERS, MD as PCP - Cardiology (Cardiology) Liam Lerner, MD as Consulting Physician (Orthopedic Surgery) Avram Lupita BRAVO, MD as Consulting Physician (Gastroenterology)  I have updated your Care Teams any recent Medical Services you may have received from other providers in the past year.     Assessment:   This is a routine  wellness examination for Joe Williams.  Hearing/Vision screen Hearing Screening - Comments:: Denies hearing difficulties   Vision Screening - Comments::  up to date with routine eye exams with [facility]    Goals Addressed             This Visit's Progress    Maintain health and independence   On track      Depression Screen     08/07/2024    9:02 AM 07/05/2024    8:29 AM 01/16/2024    3:47 PM 11/01/2023    2:55 PM 12/28/2022    8:10 AM 12/27/2021    7:57 AM 08/01/2017    8:04 AM  PHQ 2/9 Scores  PHQ - 2 Score 0 0 0 0 0 0 0    Fall Risk     08/07/2024    9:05 AM 07/05/2024    8:29 AM 01/16/2024    3:47 PM 11/01/2023    2:55 PM 12/28/2022    8:10 AM  Fall Risk   Falls in the past year? 0 0 0 0 0  Number falls in past yr: 0 0 0 0 0  Injury with Fall? 0 0 0 0 0  Risk for fall due to : No Fall Risks No Fall Risks No Fall Risks No Fall Risks No Fall Risks  Follow up Falls prevention discussed;Education provided;Falls evaluation completed Falls evaluation completed Falls evaluation completed Falls evaluation completed Falls evaluation completed    MEDICARE RISK AT HOME:  Medicare Risk at Home Any stairs in or around the home?: No If so, are there any without handrails?: No Home free of loose throw rugs in walkways, pet beds, electrical cords, etc?: Yes Adequate lighting in your home to reduce risk of falls?: Yes Life alert?: No Use of a cane, walker or w/c?: No Grab bars in the bathroom?: Yes Shower chair or bench in shower?: No Elevated toilet seat or a handicapped toilet?: Yes  TIMED UP AND GO:  Was the test performed?  No  Cognitive Function: 6CIT completed        08/07/2024  9:05 AM  6CIT Screen  What Year? 0 points  What month? 0 points  What time? 0 points  Count back from 20 0 points  Months in reverse 0 points  Repeat phrase 0 points  Total Score 0 points    Immunizations Immunization History  Administered Date(s) Administered   Fluad Quad(high  Dose 65+) 08/14/2020   INFLUENZA, HIGH DOSE SEASONAL PF 07/15/2022, 08/02/2024   Influenza Whole 08/18/2003, 07/30/2008, 08/15/2011, 08/14/2012   Influenza,inj,Quad PF,6+ Mos 08/01/2017, 08/07/2019   Influenza-Unspecified 08/14/2013, 08/14/2014, 08/15/2015   PFIZER(Purple Top)SARS-COV-2 Vaccination 06/22/2020, 07/13/2020   Pneumococcal Conjugate-13 12/22/2020   Td 03/28/2002   Tdap 12/25/2013, 07/05/2024   Zoster Recombinant(Shingrix) 12/28/2021, 03/08/2022   Zoster, Live 12/25/2013    Screening Tests Health Maintenance  Topic Date Due   COVID-19 Vaccine (3 - Pfizer risk series) 08/10/2020   Pneumococcal Vaccine: 50+ Years (2 of 2 - PPSV23, PCV20, or PCV21) 07/05/2025 (Originally 02/16/2021)   Medicare Annual Wellness (AWV)  08/07/2025   Colonoscopy  12/13/2026   DTaP/Tdap/Td (4 - Td or Tdap) 07/05/2034   Influenza Vaccine  Completed   Hepatitis C Screening  Completed   Zoster Vaccines- Shingrix  Completed   HPV VACCINES  Aged Out   Meningococcal B Vaccine  Aged Out    Health Maintenance Items Addressed: Information provided on vaccine recommendations   Additional Screening:  Vision Screening: Recommended annual ophthalmology exams for early detection of glaucoma and other disorders of the eye. Is the patient up to date with their annual eye exam?  Yes  Who is the provider or what is the name of the office in which the patient attends annual eye exams? [facility]  Dental Screening: Recommended annual dental exams for proper oral hygiene  Community Resource Referral / Chronic Care Management: CRR required this visit?  No   CCM required this visit?  No   Plan:    I have personally reviewed and noted the following in the patient's chart:   Medical and social history Use of alcohol, tobacco or illicit drugs  Current medications and supplements including opioid prescriptions. Patient is not currently taking opioid prescriptions. Functional ability and  status Nutritional status Physical activity Advanced directives List of other physicians Hospitalizations, surgeries, and ER visits in previous 12 months Vitals Screenings to include cognitive, depression, and falls Referrals and appointments  In addition, I have reviewed and discussed with patient certain preventive protocols, quality metrics, and best practice recommendations. A written personalized care plan for preventive services as well as general preventive health recommendations were provided to patient.   Joe Williams, [place]   0/75/7974   After Visit Summary: (MyChart) Due to this being a telephonic visit, the after visit summary with patients personalized plan was offered to patient via MyChart   Notes: Nothing significant to report at this time.

## 2024-08-07 NOTE — Patient Instructions (Signed)
 [name],  Thank you for taking the time for your Medicare Wellness Visit. I appreciate your continued commitment to your health goals. Please review the care plan we discussed, and feel free to reach out if I can assist you further.  Medicare recommends these wellness visits once per year to help you and your care team stay ahead of potential health issues. These visits are designed to focus on prevention, allowing your provider to concentrate on managing your acute and chronic conditions during your regular appointments.  Please note that Annual Wellness Visits do not include a physical exam. Some assessments may be limited, especially if the visit was conducted virtually. If needed, we may recommend a separate in-person follow-up with your provider.  Ongoing Care Seeing your primary care provider every 3 to 6 months helps us  monitor your health and provide consistent, personalized care.   Referrals If a referral was made during today's visit and you haven't received any updates within two weeks, please contact the referred provider directly to check on the status.  Recommended Screenings:  Health Maintenance  Topic Date Due   COVID-19 Vaccine (3 - Pfizer risk series) 08/10/2020   Pneumococcal Vaccine for age over 52 (2 of 2 - PPSV23, PCV20, or PCV21) 07/05/2025*   Medicare Annual Wellness Visit  08/07/2025   Colon Cancer Screening  12/13/2026   DTaP/Tdap/Td vaccine (4 - Td or Tdap) 07/05/2034   Flu Shot  Completed   Hepatitis C Screening  Completed   Zoster (Shingles) Vaccine  Completed   HPV Vaccine  Aged Out   Meningitis B Vaccine  Aged Out  *Topic was postponed. The date shown is not the original due date.       08/07/2024    9:04 AM  Advanced Directives  Does Patient Have a Medical Advance Directive? No  Would patient like information on creating a medical advance directive? Yes (MAU/Ambulatory/Procedural Areas - Information given)   Advance Care Planning is important  because it: Ensures you receive medical care that aligns with your values, goals, and preferences. Provides guidance to your family and loved ones, reducing the emotional burden of decision-making during critical moments.  Information on Advanced Care Planning can be found at [place]  Secretary of [facility] Advance Health Care Directives Advance Health Care Directives ([email or web address]   Vision: Annual vision screenings are recommended for early detection of glaucoma, cataracts, and diabetic retinopathy. These exams can also reveal signs of chronic conditions such as diabetes and high blood pressure.  Dental: Annual dental screenings help detect early signs of oral cancer, gum disease, and other conditions linked to overall health, including heart disease and diabetes.  Please see the attached documents for additional preventive care recommendations.

## 2024-08-19 ENCOUNTER — Encounter: Payer: Self-pay | Admitting: Family Medicine

## 2024-08-20 ENCOUNTER — Other Ambulatory Visit: Payer: Self-pay | Admitting: Family Medicine

## 2024-08-20 DIAGNOSIS — Z96651 Presence of right artificial knee joint: Secondary | ICD-10-CM | POA: Diagnosis not present

## 2024-08-20 DIAGNOSIS — M25562 Pain in left knee: Secondary | ICD-10-CM | POA: Diagnosis not present

## 2024-08-20 DIAGNOSIS — Z471 Aftercare following joint replacement surgery: Secondary | ICD-10-CM | POA: Diagnosis not present

## 2024-08-20 MED ORDER — CHOLESTYRAMINE 4 GM/DOSE PO POWD: 4.0000 g | Freq: Two times a day (BID) | ORAL | 5 refills | Status: DC

## 2024-09-09 DIAGNOSIS — K080 Exfoliation of teeth due to systemic causes: Secondary | ICD-10-CM | POA: Diagnosis not present

## 2024-10-03 DIAGNOSIS — M25562 Pain in left knee: Secondary | ICD-10-CM | POA: Diagnosis not present

## 2024-10-03 DIAGNOSIS — M25561 Pain in right knee: Secondary | ICD-10-CM | POA: Diagnosis not present

## 2024-10-16 ENCOUNTER — Other Ambulatory Visit: Payer: Self-pay | Admitting: Internal Medicine

## 2024-11-14 ENCOUNTER — Other Ambulatory Visit: Payer: Self-pay | Admitting: Family Medicine

## 2024-11-14 ENCOUNTER — Encounter: Payer: Self-pay | Admitting: Family Medicine

## 2024-11-15 ENCOUNTER — Other Ambulatory Visit: Payer: Self-pay

## 2024-11-15 DIAGNOSIS — E785 Hyperlipidemia, unspecified: Secondary | ICD-10-CM

## 2024-11-15 DIAGNOSIS — I2583 Coronary atherosclerosis due to lipid rich plaque: Secondary | ICD-10-CM

## 2024-11-15 DIAGNOSIS — R931 Abnormal findings on diagnostic imaging of heart and coronary circulation: Secondary | ICD-10-CM

## 2024-11-15 MED ORDER — CHOLESTYRAMINE 4 GM/DOSE PO POWD: 4.0000 g | Freq: Two times a day (BID) | ORAL | 5 refills | Status: DC

## 2024-11-15 NOTE — Telephone Encounter (Signed)
 Requested medication (s) are due for refill today: routing for review  Requested medication (s) are on the active medication list: no  Last refill:  11/01/23  Future visit scheduled: yes  Notes to clinic:  Unable to refill per protocol, last refill by another provider.      Requested Prescriptions  Pending Prescriptions Disp Refills   colesevelam  (WELCHOL ) 625 MG tablet [Pharmacy Med Name: COLESEVELAM  625 MG TABLET] 540 tablet     Sig: TAKE 1-3 TABLETS ONCE OR TWICE A DAY TO PREVENT DIARRHEA     Cardiovascular:  Antilipid - Bile Acid Sequestrants Failed - 11/15/2024  1:17 PM      Failed - Lipid Panel in normal range within the last 12 months    Cholesterol, Total  Date Value Ref Range Status  10/20/2021 118 100 - 199 mg/dL Final   Cholesterol  Date Value Ref Range Status  06/28/2024 114 <200 mg/dL Final   LDL Cholesterol (Calc)  Date Value Ref Range Status  06/28/2024 52 mg/dL (calc) Final    Comment:    Reference range: <100 . Desirable range <100 mg/dL for primary prevention;   <70 mg/dL for patients with CHD or diabetic patients  with > or = 2 CHD risk factors. SABRA LDL-C is now calculated using the Martin-Hopkins  calculation, which is a validated novel method providing  better accuracy than the Friedewald equation in the  estimation of LDL-C.  Gladis APPLETHWAITE et al. SANDREA. 7986;689(80): 2061-2068  (http://education.[facility].com/faq/FAQ164)    HDL  Date Value Ref Range Status  06/28/2024 43 > OR = 40 mg/dL Final  87/92/7977 46 >60 mg/dL Final   Triglycerides  Date Value Ref Range Status  06/28/2024 104 <150 mg/dL Final         Passed - Valid encounter within last 12 months    Recent Outpatient Visits           4 months ago Hyperlipidemia, unspecified hyperlipidemia type   [place] [facility] Duanne Butler DASEN, MD   5 months ago GLUCOSE INTOLERANCE   [place] [facility] [place], Butler DASEN, MD

## 2024-11-18 ENCOUNTER — Other Ambulatory Visit: Payer: Self-pay

## 2024-11-18 DIAGNOSIS — E785 Hyperlipidemia, unspecified: Secondary | ICD-10-CM

## 2024-11-18 DIAGNOSIS — I2583 Coronary atherosclerosis due to lipid rich plaque: Secondary | ICD-10-CM

## 2024-11-18 DIAGNOSIS — R931 Abnormal findings on diagnostic imaging of heart and coronary circulation: Secondary | ICD-10-CM

## 2024-11-18 MED ORDER — COLESEVELAM HCL 625 MG PO TABS: 625.0000 mg | ORAL_TABLET | Freq: Two times a day (BID) | ORAL | 1 refills | Status: AC

## 2025-01-10 ENCOUNTER — Other Ambulatory Visit

## 2025-01-17 ENCOUNTER — Encounter: Admitting: Family Medicine

## 2025-03-05 ENCOUNTER — Ambulatory Visit: Admitting: Cardiology

## 2025-08-13 ENCOUNTER — Ambulatory Visit
